# Patient Record
Sex: Male | Born: 1945 | Race: White | Hispanic: No | Marital: Married | Smoking: Never smoker
Health system: Southern US, Community
[De-identification: ages and names within clinical notes are randomized; demographics above are authoritative.]

## PROBLEM LIST (undated history)

## (undated) DIAGNOSIS — I2510 Atherosclerotic heart disease of native coronary artery without angina pectoris: Secondary | ICD-10-CM

## (undated) HISTORY — PX: STENT PLACEMENT VASCULAR (ARMC HX): HXRAD1737

---

## 2022-03-12 ENCOUNTER — Emergency Department (HOSPITAL_BASED_OUTPATIENT_CLINIC_OR_DEPARTMENT_OTHER): Payer: Medicare HMO

## 2022-03-12 ENCOUNTER — Emergency Department (HOSPITAL_BASED_OUTPATIENT_CLINIC_OR_DEPARTMENT_OTHER)
Admission: EM | Admit: 2022-03-12 | Discharge: 2022-03-12 | Disposition: A | Discharge status: Home / Self Care | Payer: Medicare HMO | Attending: Emergency Medicine | Admitting: Emergency Medicine

## 2022-03-12 ENCOUNTER — Other Ambulatory Visit (HOSPITAL_BASED_OUTPATIENT_CLINIC_OR_DEPARTMENT_OTHER): Payer: Self-pay

## 2022-03-12 ENCOUNTER — Encounter (HOSPITAL_BASED_OUTPATIENT_CLINIC_OR_DEPARTMENT_OTHER): Payer: Self-pay | Admitting: Emergency Medicine

## 2022-03-12 ENCOUNTER — Other Ambulatory Visit: Payer: Self-pay

## 2022-03-12 DIAGNOSIS — R079 Chest pain, unspecified: Secondary | ICD-10-CM | POA: Insufficient documentation

## 2022-03-12 DIAGNOSIS — Z20822 Contact with and (suspected) exposure to covid-19: Secondary | ICD-10-CM | POA: Insufficient documentation

## 2022-03-12 DIAGNOSIS — J181 Lobar pneumonia, unspecified organism: Secondary | ICD-10-CM | POA: Diagnosis not present

## 2022-03-12 DIAGNOSIS — W19XXXA Unspecified fall, initial encounter: Secondary | ICD-10-CM | POA: Diagnosis not present

## 2022-03-12 DIAGNOSIS — J189 Pneumonia, unspecified organism: Secondary | ICD-10-CM

## 2022-03-12 DIAGNOSIS — I2510 Atherosclerotic heart disease of native coronary artery without angina pectoris: Secondary | ICD-10-CM | POA: Diagnosis not present

## 2022-03-12 DIAGNOSIS — S90912A Unspecified superficial injury of left ankle, initial encounter: Secondary | ICD-10-CM | POA: Insufficient documentation

## 2022-03-12 HISTORY — DX: Atherosclerotic heart disease of native coronary artery without angina pectoris: I25.10

## 2022-03-12 LAB — CBC
HCT: 47 % (ref 39.0–52.0)
Hemoglobin: 16.1 g/dL (ref 13.0–17.0)
MCH: 31.3 pg (ref 26.0–34.0)
MCHC: 34.3 g/dL (ref 30.0–36.0)
MCV: 91.3 fL (ref 80.0–100.0)
Platelets: 177 10*3/uL (ref 150–400)
RBC: 5.15 MIL/uL (ref 4.22–5.81)
RDW: 13.1 % (ref 11.5–15.5)
WBC: 9.9 10*3/uL (ref 4.0–10.5)
nRBC: 0 % (ref 0.0–0.2)

## 2022-03-12 LAB — BASIC METABOLIC PANEL
Anion gap: 7 (ref 5–15)
BUN: 15 mg/dL (ref 8–23)
CO2: 24 mmol/L (ref 22–32)
Calcium: 9.3 mg/dL (ref 8.9–10.3)
Chloride: 109 mmol/L (ref 98–111)
Creatinine, Ser: 1.01 mg/dL (ref 0.61–1.24)
GFR, Estimated: >60 mL/min (ref >60)
Glucose, Bld: 124 mg/dL — ABNORMAL HIGH (ref 70–99)
Potassium: 4.1 mmol/L (ref 3.5–5.1)
Sodium: 140 mmol/L (ref 135–145)

## 2022-03-12 LAB — TROPONIN I (HIGH SENSITIVITY): Troponin I (High Sensitivity): 4 ng/L (ref <18)

## 2022-03-12 LAB — RESP PANEL BY RT-PCR (FLU A&B, COVID) ARPGX2
Influenza A by PCR: NEGATIVE
Influenza B by PCR: NEGATIVE
SARS Coronavirus 2 by RT PCR: NEGATIVE

## 2022-03-12 MED ORDER — AZITHROMYCIN 250 MG PO TABS
250.0000 mg | ORAL_TABLET | Freq: Every day | ORAL | 0 refills | Status: DC
  Filled 2022-03-12: qty 6, 5d supply, fill #0

## 2022-03-12 MED ORDER — CEFDINIR 300 MG PO CAPS
300.0000 mg | ORAL_CAPSULE | Freq: Two times a day (BID) | ORAL | 0 refills | Status: AC
  Filled 2022-03-12: qty 20, 10d supply, fill #0

## 2022-03-12 NOTE — ED Provider Notes (Signed)
?[facility] EMERGENCY DEPARTMENT ?Provider Note ? ? ?CSN: [id number] ?Arrival date & time: 03/12/22  0941 ? ?  ? ?History ? ?Chief Complaint  ?Patient presents with  ? URI  ? ? ?Benjamin [name] is a 76 y.o. male. ? ?HPI ? ?  ? ?Cough and congestion for 1 week ?Left sided chest pain this AM, came and went, burning sharp pain to left side ?Cough worse when trying to sleep at night ?No shortness of breath, no orthopnea, no fevers ?No leg pain or swelling, sprained an ankle a week ago, fell, has pain over ankle but no leg pain. Has been able to walk on it. Feels like sprained ankles in past. Did not fall, no other injury other than ankle.  ? ? ?Past Medical History:  ?Diagnosis Date  ? Coronary artery disease   ?  ? ?Home Medications ?Prior to Admission medications   ?Medication Sig Start Date End Date Taking? Authorizing Provider  ?azithromycin (ZITHROMAX) 250 MG tablet Take 2 tablets by mouth on day 1, then take 1 tablet daily until finished. 03/12/22  Yes Alvira Monday, MD  ?cefdinir (OMNICEF) 300 MG capsule Take 1 capsule (300 mg total) by mouth 2 (two) times daily for 10 days. 03/12/22 03/22/22 Yes Alvira Monday, MD  ?   ? ?Allergies    ?Penicillins   ? ?Review of Systems   ?Review of Systems ? ?Physical Exam ?Updated Vital Signs ?BP (!) 145/78   Pulse 73   Temp 98.2 ?F (36.8 ?C)   Resp 18   SpO2 93%  ?Physical Exam ?Vitals and nursing note reviewed.  ?Constitutional:   ?   General: He is not in acute distress. ?   Appearance: He is well-developed. He is not diaphoretic.  ?HENT:  ?   Head: Normocephalic and atraumatic.  ?Eyes:  ?   Conjunctiva/sclera: Conjunctivae normal.  ?Cardiovascular:  ?   Rate and Rhythm: Normal rate and regular rhythm.  ?   Heart sounds: Normal heart sounds. No murmur heard. ?  No friction rub. No gallop.  ?Pulmonary:  ?   Effort: Pulmonary effort is normal. No respiratory distress.  ?   Breath sounds: Normal breath sounds. No wheezing or rales.  ?Abdominal:  ?   General:  There is no distension.  ?   Palpations: Abdomen is soft.  ?   Tenderness: There is no abdominal tenderness. There is no guarding.  ?Musculoskeletal:     ?   General: Signs of injury (left ankle with tenderness ATFL) present.  ?   Cervical back: Normal range of motion.  ?Skin: ?   General: Skin is warm and dry.  ?Neurological:  ?   Mental Status: He is alert and oriented to person, place, and time.  ? ? ?ED Results / Procedures / Treatments   ?Labs ?(all labs ordered are listed, but only abnormal results are displayed) ?Labs Reviewed  ?BASIC METABOLIC PANEL - Abnormal; Notable for the following components:  ?    Result Value  ? Glucose, Bld 124 (*)   ? All other components within normal limits  ?RESP PANEL BY RT-PCR (FLU A&B, COVID) [id number]  ?CBC  ?TROPONIN I (HIGH SENSITIVITY)  ?TROPONIN I (HIGH SENSITIVITY)  ? ? ?EKG ?EKG Interpretation ? ?Date/Time:  Thursday March 12 2022 10:16:46 EDT ?Ventricular Rate:  85 ?PR Interval:  146 ?QRS Duration: 84 ?QT Interval:  366 ?QTC Calculation: 435 ?R Axis:   -48 ?Text Interpretation: Normal sinus rhythm Left anterior fascicular block Abnormal  ECG No previous ECGs available Confirmed by Alvira Monday ([id number]) on 03/12/2022 10:34:13 AM ? ?Radiology ?DG Chest 2 View ? ?Result Date: 03/12/2022 ?CLINICAL DATA:  Chest pain. EXAM: CHEST - 2 VIEW COMPARISON:  August 09, 2014. FINDINGS: The heart size and mediastinal contours are within normal limits. Mild right infrahilar atelectasis or infiltrate is noted. Mild left basilar subsegmental atelectasis or scarring is noted. The visualized skeletal structures are unremarkable. IMPRESSION: Mild left basilar subsegmental atelectasis or scarring. Mild right infrahilar atelectasis or infiltrate. Electronically Signed   By: Lupita Raider M.D.   On: 03/12/2022 10:26   ? ?Procedures ?Procedures  ? ? ?Medications Ordered in ED ?Medications - No data to display ? ?ED Course/ Medical Decision Making/ A&P ?  ?                        ?Medical  Decision Making ?Amount and/or Complexity of Data Reviewed ?Labs: ordered. ?Radiology: ordered. ? ?Risk ?Prescription drug management. ? ? ?76 year old male with a history of coronary artery disease presents with cough and congestion for 1 week, and a sharp pain to his left side. ? ?Differential diagnosis for chest pain includes pulmonary embolus, dissection, pneumothorax, pneumonia, ACS, myocarditis, pericarditis.  EKG was done and evaluate by me and showed no acute ST changes and no signs of pericarditis.  ? ?Chest x-ray was done and evaluated by me and radiology and showed concern for right-sided infiltrate, and given history of increasing cough suspect this represents a pneumonia.  No sign of pneumothorax.   ? ?Have low suspicion for pulmonary embolus given no shortness of breath, tachycardia, no asymmetric leg swelling, immobilization, recent surgery and history more consistent with pneumonia. Troponin negative, this pain feels different than prior chest pain he has had related to coronary artery disease, is very localized, and likely musculoskeletal secondary to severe cough and pneumonia, and have low suspicion for ACS do not feel history or exam are consistent with aortic dissection. No leg swelling or findings on exam to suggest CHF as etiology of coughing.  ? ?He is afebrile, with a normal white blood cell count, hemodynamically stable, and appropriate for outpatient treatment of pneumonia. ? ?Given prescription for cefdinir and azithromycin.  Recommend close primary care physician follow-up, and discussed reasons to return. ? ? ? ? ? ? ? ? ? ?Final Clinical Impression(s) / ED Diagnoses ?Final diagnoses:  ?Pneumonia of right middle lobe due to infectious organism  ? ? ?Rx / DC Orders ?ED Discharge Orders   ? ?      Ordered  ?  azithromycin (ZITHROMAX) 250 MG tablet  Daily       ? 03/12/22 1209  ?  cefdinir (OMNICEF) 300 MG capsule  2 times daily       ? 03/12/22 1209  ? ?  ?  ? ?  ? ? ?  ?Alvira Monday,  MD ?03/13/22 0825 ? ?

## 2022-03-12 NOTE — ED Triage Notes (Signed)
Pt here from home with c/o cough and sinus drainage has tried several otc meds for allergies without relief , no fevers , also had a sharp pain in his left  side of his chest this morning also  ?

## 2022-08-12 ENCOUNTER — Observation Stay (HOSPITAL_BASED_OUTPATIENT_CLINIC_OR_DEPARTMENT_OTHER)
Admission: EM | Admit: 2022-08-12 | Discharge: 2022-08-14 | Disposition: A | Discharge status: Home / Self Care | Payer: Medicare HMO | Attending: Internal Medicine | Admitting: Internal Medicine

## 2022-08-12 ENCOUNTER — Encounter (HOSPITAL_BASED_OUTPATIENT_CLINIC_OR_DEPARTMENT_OTHER): Payer: Self-pay

## 2022-08-12 ENCOUNTER — Other Ambulatory Visit: Payer: Self-pay

## 2022-08-12 ENCOUNTER — Emergency Department (HOSPITAL_BASED_OUTPATIENT_CLINIC_OR_DEPARTMENT_OTHER): Payer: Medicare HMO

## 2022-08-12 ENCOUNTER — Encounter (HOSPITAL_COMMUNITY): Payer: Self-pay

## 2022-08-12 DIAGNOSIS — E785 Hyperlipidemia, unspecified: Secondary | ICD-10-CM | POA: Diagnosis present

## 2022-08-12 DIAGNOSIS — I2489 Other forms of acute ischemic heart disease: Secondary | ICD-10-CM | POA: Diagnosis present

## 2022-08-12 DIAGNOSIS — I248 Other forms of acute ischemic heart disease: Secondary | ICD-10-CM | POA: Diagnosis present

## 2022-08-12 DIAGNOSIS — I5042 Chronic combined systolic (congestive) and diastolic (congestive) heart failure: Secondary | ICD-10-CM | POA: Insufficient documentation

## 2022-08-12 DIAGNOSIS — I110 Hypertensive heart disease with heart failure: Secondary | ICD-10-CM | POA: Diagnosis not present

## 2022-08-12 DIAGNOSIS — I10 Essential (primary) hypertension: Secondary | ICD-10-CM | POA: Diagnosis not present

## 2022-08-12 DIAGNOSIS — I2510 Atherosclerotic heart disease of native coronary artery without angina pectoris: Secondary | ICD-10-CM | POA: Insufficient documentation

## 2022-08-12 DIAGNOSIS — I214 Non-ST elevation (NSTEMI) myocardial infarction: Secondary | ICD-10-CM | POA: Diagnosis not present

## 2022-08-12 DIAGNOSIS — Z79899 Other long term (current) drug therapy: Secondary | ICD-10-CM | POA: Diagnosis not present

## 2022-08-12 DIAGNOSIS — R079 Chest pain, unspecified: Secondary | ICD-10-CM

## 2022-08-12 DIAGNOSIS — I5020 Unspecified systolic (congestive) heart failure: Secondary | ICD-10-CM

## 2022-08-12 DIAGNOSIS — Z7982 Long term (current) use of aspirin: Secondary | ICD-10-CM | POA: Insufficient documentation

## 2022-08-12 DIAGNOSIS — N400 Enlarged prostate without lower urinary tract symptoms: Secondary | ICD-10-CM | POA: Diagnosis present

## 2022-08-12 DIAGNOSIS — R5383 Other fatigue: Secondary | ICD-10-CM | POA: Diagnosis present

## 2022-08-12 LAB — CBC WITH DIFFERENTIAL/PLATELET
Abs Immature Granulocytes: 0.02 10*3/uL (ref 0.00–0.07)
Basophils Absolute: 0 10*3/uL (ref 0.0–0.1)
Basophils Relative: 1 %
Eosinophils Absolute: 0.3 10*3/uL (ref 0.0–0.5)
Eosinophils Relative: 4 %
HCT: 41.9 % (ref 39.0–52.0)
Hemoglobin: 14.5 g/dL (ref 13.0–17.0)
Immature Granulocytes: 0 %
Lymphocytes Relative: 19 %
Lymphs Abs: 1.5 10*3/uL (ref 0.7–4.0)
MCH: 31.9 pg (ref 26.0–34.0)
MCHC: 34.6 g/dL (ref 30.0–36.0)
MCV: 92.3 fL (ref 80.0–100.0)
Monocytes Absolute: 1 10*3/uL (ref 0.1–1.0)
Monocytes Relative: 12 %
Neutro Abs: 5 10*3/uL (ref 1.7–7.7)
Neutrophils Relative %: 64 %
Platelets: 168 10*3/uL (ref 150–400)
RBC: 4.54 MIL/uL (ref 4.22–5.81)
RDW: 12.8 % (ref 11.5–15.5)
WBC: 7.8 10*3/uL (ref 4.0–10.5)
nRBC: 0 % (ref 0.0–0.2)

## 2022-08-12 LAB — COMPREHENSIVE METABOLIC PANEL
ALT: 32 U/L (ref 0–44)
AST: 26 U/L (ref 15–41)
Albumin: 3.6 g/dL (ref 3.5–5.0)
Alkaline Phosphatase: 70 U/L (ref 38–126)
Anion gap: 6 (ref 5–15)
BUN: 17 mg/dL (ref 8–23)
CO2: 22 mmol/L (ref 22–32)
Calcium: 8.3 mg/dL — ABNORMAL LOW (ref 8.9–10.3)
Chloride: 110 mmol/L (ref 98–111)
Creatinine, Ser: 0.91 mg/dL (ref 0.61–1.24)
GFR, Estimated: >60 mL/min (ref >60)
Glucose, Bld: 128 mg/dL — ABNORMAL HIGH (ref 70–99)
Potassium: 3.7 mmol/L (ref 3.5–5.1)
Sodium: 138 mmol/L (ref 135–145)
Total Bilirubin: 0.6 mg/dL (ref 0.3–1.2)
Total Protein: 6.6 g/dL (ref 6.5–8.1)

## 2022-08-12 LAB — TROPONIN I (HIGH SENSITIVITY)
Troponin I (High Sensitivity): 344 ng/L (ref <18)
Troponin I (High Sensitivity): 410 ng/L (ref <18)
Troponin I (High Sensitivity): 231 ng/L (ref <18)
Troponin I (High Sensitivity): 423 ng/L (ref <18)

## 2022-08-12 LAB — LIPASE, BLOOD: Lipase: 29 U/L (ref 11–51)

## 2022-08-12 MED ORDER — ACETAMINOPHEN 325 MG PO TABS
650.0000 mg | ORAL_TABLET | Freq: Four times a day (QID) | ORAL | Status: DC | PRN
  Administered 2022-08-13 (×3): 650 mg via ORAL
  Filled 2022-08-12 (×2): qty 2

## 2022-08-12 MED ORDER — HEPARIN BOLUS VIA INFUSION
4000.0000 [IU] | Freq: Once | INTRAVENOUS | Status: AC
  Administered 2022-08-12: 4000 [IU] via INTRAVENOUS

## 2022-08-12 MED ORDER — HEPARIN SODIUM (PORCINE) 5000 UNIT/ML IJ SOLN: 4000.0000 [IU] | Freq: Once | INTRAMUSCULAR | Status: DC

## 2022-08-12 MED ORDER — SODIUM CHLORIDE 0.9% FLUSH
3.0000 mL | Freq: Two times a day (BID) | INTRAVENOUS | Status: DC
  Administered 2022-08-13 – 2022-08-14 (×3): 3 mL via INTRAVENOUS

## 2022-08-12 MED ORDER — ALBUTEROL SULFATE (2.5 MG/3ML) 0.083% IN NEBU: 2.5000 mg | INHALATION_SOLUTION | Freq: Four times a day (QID) | RESPIRATORY_TRACT | Status: DC | PRN

## 2022-08-12 MED ORDER — ACETAMINOPHEN 650 MG RE SUPP: 650.0000 mg | Freq: Four times a day (QID) | RECTAL | Status: DC | PRN

## 2022-08-12 MED ORDER — HEPARIN (PORCINE) 25000 UT/250ML-% IV SOLN
1200.0000 [IU]/h | INTRAVENOUS | Status: DC
  Administered 2022-08-12 – 2022-08-13 (×2): 1200 [IU]/h via INTRAVENOUS
  Filled 2022-08-12 (×2): qty 250

## 2022-08-12 MED ORDER — INFLUENZA VAC A&B SA ADJ QUAD 0.5 ML IM PRSY
0.5000 mL | PREFILLED_SYRINGE | INTRAMUSCULAR | Status: DC
  Filled 2022-08-12: qty 0.5

## 2022-08-12 MED ORDER — CALCIUM GLUCONATE-NACL 2-0.675 GM/100ML-% IV SOLN
2.0000 g | Freq: Once | INTRAVENOUS | Status: AC
  Administered 2022-08-13: 2000 mg via INTRAVENOUS
  Filled 2022-08-12: qty 100

## 2022-08-12 MED ORDER — ASPIRIN 81 MG PO CHEW
324.0000 mg | CHEWABLE_TABLET | Freq: Once | ORAL | Status: AC
  Administered 2022-08-12: 324 mg via ORAL
  Filled 2022-08-12: qty 4

## 2022-08-12 MED ORDER — HEPARIN (PORCINE) 25000 UT/250ML-% IV SOLN: 12.0000 [IU]/kg/h | INTRAVENOUS | Status: DC

## 2022-08-12 NOTE — Progress Notes (Signed)
ANTICOAGULATION CONSULT NOTE - Initial Consult  Pharmacy Consult for heparin Indication: chest pain/ACS  Allergies  Allergen Reactions   Penicillins     Patient Measurements: Height: 6' (182.9 cm) Weight: 88.9 kg (196 lb) IBW/kg (Calculated) : 77.6 Heparin Dosing Weight: 88.9kg  Vital Signs: Temp: 97.5 F (36.4 C) (09/13 1412) Temp Source: Oral (09/13 1412) BP: 151/91 (09/13 1600) Pulse Rate: 65 (09/13 1600)  Labs: Recent Labs    08/12/22 1439 08/12/22 1440  HGB 14.5  --   HCT 41.9  --   PLT 168  --   CREATININE 0.91  --   TROPONINIHS  --  344*    Estimated Creatinine Clearance: 75.8 mL/min (by C-G formula based on SCr of 0.91 mg/dL).   Medical History: Past Medical History:  Diagnosis Date   Coronary artery disease     Medications:  (Not in a hospital admission)  Scheduled:   heparin  4,000 Units Intravenous Once    Assessment: 11 yoM with PMH of CAD who presented with high blood pressure and fatigue. Pharmacy consulted to dose heparin for ACS/NSTEMI. Troponins elevated at 344 with CBC stable. No PTA anticoagulation  Goal of Therapy:  Heparin level 0.3-0.7 units/ml Monitor platelets by anticoagulation protocol: Yes   Plan:  Give 4000 units bolus x 1 Start heparin infusion at 1200 units/hr Check anti-Xa level in 8 hours and daily while on heparin Continue to monitor H&H and platelets  Arabella Merles, PharmD. [facility] Acute Care PGY-1  08/12/2022 4:25 PM

## 2022-08-12 NOTE — ED Provider Notes (Signed)
[facility] EMERGENCY DEPARTMENT Provider Note   CSN: [id number] Arrival date & time: 08/12/22  1404     History  Chief Complaint  Patient presents with   Hypertension    Benjamin [name] is a 76 y.o. male.  Patient here with elevated high blood pressure, fatigue.  History of CAD.  He had some indigestion about 5 days ago after eating some fried food.  He has not had the same energy level here the last several weeks.  He has noticed his blood pressure mildly elevated recently.  He states he did play tennis 2 days ago without any issues.  Has no active chest pain or shortness of breath or abdominal pain or headaches, vision changes or stroke symptoms.  Has had 4 stents in the past.  Last had stents in 2017.  Supposed to follow-up with his cardiologist in December.  Denies any infectious symptoms including cough and sputum production.  No current abdominal pain.  The history is provided by the patient.       Home Medications Prior to Admission medications   Medication Sig Start Date End Date Taking? Authorizing Provider  Alpha-Lipoic Acid (LIPOIC ACID PO) Take 1 capsule by mouth daily.   Yes [provider]  aspirin EC 81 MG tablet Take 81 mg by mouth daily.   Yes [provider]  atorvastatin (LIPITOR) 80 MG tablet Take 80 mg by mouth at bedtime. 05/25/22  Yes [provider]  carvedilol (COREG) 6.25 MG tablet Take 6.25 mg by mouth 2 (two) times daily. 06/18/22  Yes [provider]  Cholecalciferol 50 MCG (2000 UT) CAPS Take 2,000 Units by mouth daily.   Yes [provider]  Cyanocobalamin (VITAMIN B 12 PO) Take 1 tablet by mouth daily.   Yes [provider]  Multiple Vitamins-Minerals (MULTIVITAMIN WITH MINERALS) tablet Take 1 tablet by mouth daily. One a Day Mens   Yes [provider]  Omega-3 1000 MG CAPS Take 1,000 mg by mouth daily.   Yes [provider]  pantoprazole (PROTONIX) 40 MG tablet Take  40 mg by mouth daily. 06/03/22  Yes [provider]  tamsulosin (FLOMAX) 0.4 MG CAPS capsule Take 0.4 mg by mouth at bedtime. 05/23/22  Yes [provider]      Allergies    Penicillins    Review of Systems   Review of Systems  Physical Exam Updated Vital Signs BP 137/80   Pulse (!) 56   Temp (!) 97.5 F (36.4 C) (Oral)   Resp 18   Ht 6\' 1"  (1.854 m)   Wt 89 kg   SpO2 93%   BMI 25.89 kg/m  Physical Exam Vitals and nursing note reviewed.  Constitutional:      General: He is not in acute distress.    Appearance: He is well-developed. He is not ill-appearing.  HENT:     Head: Normocephalic and atraumatic.     Nose: Nose normal.     Mouth/Throat:     Mouth: Mucous membranes are moist.  Eyes:     Extraocular Movements: Extraocular movements intact.     Conjunctiva/sclera: Conjunctivae normal.     Pupils: Pupils are equal, round, and reactive to light.  Cardiovascular:     Rate and Rhythm: Normal rate and regular rhythm.     Pulses: Normal pulses.     Heart sounds: Normal heart sounds. No murmur heard. Pulmonary:     Effort: Pulmonary effort is normal. No respiratory distress.  Breath sounds: Normal breath sounds.  Abdominal:     Palpations: Abdomen is soft.     Tenderness: There is no abdominal tenderness.  Musculoskeletal:        General: No swelling. Normal range of motion.     Cervical back: Normal range of motion and neck supple.  Skin:    General: Skin is warm and dry.     Capillary Refill: Capillary refill takes less than 2 seconds.  Neurological:     General: No focal deficit present.     Mental Status: He is alert and oriented to person, place, and time.     Cranial Nerves: No cranial nerve deficit.     Sensory: No sensory deficit.     Motor: No weakness.     Coordination: Coordination normal.     Comments: 5+ out of 5 strength throughout, normal sensation, no drift, normal finger-nose-finger, normal speech  Psychiatric:        Mood  and Affect: Mood normal.     ED Results / Procedures / Treatments   Labs (all labs ordered are listed, but only abnormal results are displayed) Labs Reviewed  COMPREHENSIVE METABOLIC PANEL - Abnormal; Notable for the following components:      Result Value   Glucose, Bld 128 (*)    Calcium 8.3 (*)    All other components within normal limits  LIPID PANEL - Abnormal; Notable for the following components:   HDL 30 (*)    All other components within normal limits  HEPATIC FUNCTION PANEL - Abnormal; Notable for the following components:   Total Protein 6.1 (*)    Albumin 3.4 (*)    All other components within normal limits  TROPONIN I (HIGH SENSITIVITY) - Abnormal; Notable for the following components:   Troponin I (High Sensitivity) 344 (*)    All other components within normal limits  TROPONIN I (HIGH SENSITIVITY) - Abnormal; Notable for the following components:   Troponin I (High Sensitivity) 410 (*)    All other components within normal limits  TROPONIN I (HIGH SENSITIVITY) - Abnormal; Notable for the following components:   Troponin I (High Sensitivity) 231 (*)    All other components within normal limits  TROPONIN I (HIGH SENSITIVITY) - Abnormal; Notable for the following components:   Troponin I (High Sensitivity) 423 (*)    All other components within normal limits  MRSA NEXT GEN BY PCR, NASAL  CBC WITH DIFFERENTIAL/PLATELET  LIPASE, BLOOD  HEPARIN LEVEL (UNFRACTIONATED)  CBC  BASIC METABOLIC PANEL  TSH  MAGNESIUM    EKG EKG Interpretation  Date/Time:  Wednesday August 12 2022 17:00:20 EDT Ventricular Rate:  62 PR Interval:  149 QRS Duration: 98 QT Interval:  443 QTC Calculation: 450 R Axis:   -1 Text Interpretation: Sinus rhythm Low voltage, precordial leads Abnormal R-wave progression, early transition Confirmed by Drema Pry [phone]) on 08/13/2022 1:32:16 PM  Radiology DG Chest Portable 1 View  Result Date: 08/12/2022 CLINICAL DATA:  Fatigue EXAM:  PORTABLE CHEST 1 VIEW COMPARISON:  Chest radiograph 03/12/2022 FINDINGS: The cardiomediastinal silhouette is within normal limits. Mild linear opacities in the lung bases likely reflect subsegmental atelectasis. There is no focal consolidation or pulmonary edema. There is no pleural effusion or pneumothorax There is no acute osseous abnormality. IMPRESSION: Probable bibasilar subsegmental atelectasis. Otherwise, no radiographic evidence of acute cardiopulmonary process. Electronically Signed   By: Lesia Hausen M.D.   On: 08/12/2022 14:54    Procedures Procedures    Medications Ordered in  ED Medications  heparin ADULT infusion 100 units/mL (25000 units/261mL) (0 Units/hr Intravenous Stopped 08/13/22 1411)  sodium chloride flush (NS) 0.9 % injection 3 mL ( Intravenous Automatically Held 08/21/22 2200)  acetaminophen (TYLENOL) tablet 650 mg ( Oral MAR Hold 08/13/22 1425)    Or  acetaminophen (TYLENOL) suppository 650 mg ( Rectal MAR Hold 08/13/22 1425)  albuterol (PROVENTIL) (2.5 MG/3ML) 0.083% nebulizer solution 2.5 mg ( Nebulization MAR Hold 08/13/22 1425)  aspirin EC tablet 81 mg ( Oral Automatically Held 08/21/22 1000)  atorvastatin (LIPITOR) tablet 80 mg ( Oral Automatically Held 08/21/22 2200)  pantoprazole (PROTONIX) EC tablet 40 mg ( Oral Automatically Held 08/21/22 1000)  carvedilol (COREG) tablet 6.25 mg ( Oral Automatically Held 08/21/22 2200)  tamsulosin (FLOMAX) capsule 0.4 mg ( Oral Automatically Held 08/22/22 2200)  nitroGLYCERIN (NITROSTAT) SL tablet 0.4 mg ( Sublingual MAR Hold 08/13/22 1425)  Oral care mouth rinse ( Mouth Rinse MAR Hold 08/13/22 1425)  sodium chloride flush (NS) 0.9 % injection 3 mL (3 mLs Intravenous Not Given 08/13/22 1337)  sodium chloride flush (NS) 0.9 % injection 3 mL (has no administration in time range)  0.9 %  sodium chloride infusion (has no administration in time range)  aspirin chewable tablet 81 mg (has no administration in time range)  influenza vaccine  adjuvanted (FLUAD) injection 0.5 mL (has no administration in time range)  0.9% sodium chloride infusion (3 mL/kg/hr  89 kg Intravenous New Bag/Given 08/13/22 1301)    Followed by  0.9% sodium chloride infusion (1 mL/kg/hr  89 kg Intravenous New Bag/Given 08/13/22 1343)  Heparin (Porcine) in NaCl 1000-0.9 UT/500ML-% SOLN (500 mLs  Given 08/13/22 1429)  lidocaine (PF) (XYLOCAINE) 1 % injection (2 mLs  Given 08/13/22 1433)  Radial Cocktail/Verapamil only (10 mLs Intra-arterial Given 08/13/22 1434)  heparin sodium (porcine) injection (5,000 Units Intravenous Given 08/13/22 1437)  fentaNYL (SUBLIMAZE) injection (25 mcg Intravenous Given 08/13/22 1449)  midazolam (VERSED) injection (1 mg Intravenous Given 08/13/22 1449)  aspirin chewable tablet 324 mg (324 mg Oral Given 08/12/22 1605)  heparin bolus via infusion 4,000 Units (4,000 Units Intravenous Bolus from Bag 08/12/22 1639)  calcium gluconate 2 g/ 100 mL sodium chloride IVPB (0 mg Intravenous Stopped 08/13/22 0113)  potassium chloride SA (KLOR-CON M) CR tablet 40 mEq (40 mEq Oral Given 08/13/22 0844)    ED Course/ Medical Decision Making/ A&P Clinical Course as of 08/13/22 1504  Wed Aug 12, 2022  1501 Assumed care from Dr Lockie Mola. 76 yo M with hx of CAD sp PCI in 2017 and HTN who presented with generalized fatigue for several weeks. Follows with cards at [facility] regional. With EKG that is reassuring. CXR with atelectasis. No infectious symptoms. Planning on troponin and DC if WNL with OP fu with his PCP.  [RP]  1607 Troponin noted to be elevated.  Cardiology consulted.  Repeat troponin ordered.  Patient loaded with aspirin. [RP]  1613 Dr Sanjuana Kava from cards recommends starting heparin drip and admitting to medicine.  They will follow along as consult. [RP]  1633 Called by Dr. Jarvis Newcomer hospitalist.  He will discuss again with cardiology to see which service would be most appropriate for the patient. [RP]  1658 Dr Jarvis Newcomer from hospitalist has called back and  states that he will admit the patient to [facility] with cardiology following. [RP]    Clinical Course User Index [RP] Rondel Baton, MD  Medical Decision Making Amount and/or Complexity of Data Reviewed Labs: ordered. Radiology: ordered.  Risk OTC drugs. Prescription drug management. Decision regarding hospitalization.   Benjamin [name] [name] is here with high blood pressure, fatigue.  Blood pressure mildly elevated 160/99.  Otherwise normal vitals.  History of CAD status post stents.  No active chest pain or headache or shortness of breath.  He has been having some general fatigue here recently however play tennis several days ago without any issues.  He states he had bad indigestion episode last week after eating fried food.  He is not having any stroke symptoms.  Differential diagnosis is likely benign process but will check basic labs to evaluate for ACS, electrolyte abnormality, anemia.  Denies any infectious symptoms we will get chest x-ray.  We will get CBC, CMP, troponin, chest x-ray.  EKG shows sinus rhythm with no ischemic changes per my review and interpretation of labs.  Overall have low concern for stroke or ACS.  Patient handed off to oncoming ED staff with patient pending blood work and imaging.  Please see their note for further results, evaluation, disposition of the patient.  This chart was dictated using voice recognition software.  Despite best efforts to proofread,  errors can occur which can change the documentation meaning.        Final Clinical Impression(s) / ED Diagnoses Final diagnoses:  Chest pain, unspecified type    Rx / DC Orders ED Discharge Orders     None         Virgina Norfolk, DO 08/13/22 1504

## 2022-08-12 NOTE — ED Notes (Signed)
Report called to  Messan RN

## 2022-08-12 NOTE — ED Triage Notes (Signed)
Patient reports higher than normal blood pressures over the last two weeks. Denies any pain - states "I just feel weird" States his readings over the week have looked like: 151/102   149/99  141/102  Patient also complains of left shoulder pain.

## 2022-08-12 NOTE — ED Provider Notes (Signed)
  Physical Exam  BP (!) 164/99 (BP Location: Left Arm)   Pulse 66   Temp (!) 97.5 F (36.4 C) (Oral)   Resp 20   Ht 6' (1.829 m)   Wt 88.9 kg   SpO2 96%   BMI 26.58 kg/m   Physical Exam  Procedures  Procedures  ED Course / MDM   Clinical Course as of 08/13/22 1103  Wed Aug 12, 2022  1501 Assumed care from Dr Lockie Mola. 76 yo M with hx of CAD sp PCI in 2017 and HTN who presented with generalized fatigue for several weeks. Follows with cards at [facility] regional. With EKG that is reassuring. CXR with atelectasis. No infectious symptoms. Planning on troponin and DC if WNL with OP fu with his PCP.  [RP]  1607 Troponin noted to be elevated.  Cardiology consulted.  Repeat troponin ordered.  Patient loaded with aspirin. [RP]  1613 Dr Sanjuana Kava from cards recommends starting heparin drip and admitting to medicine.  They will follow along as consult. [RP]  1633 Called by Dr. Jarvis Newcomer hospitalist.  He will discuss again with cardiology to see which service would be most appropriate for the patient. [RP]  1658 Dr Jarvis Newcomer from hospitalist has called back and states that he will admit the patient to [facility] with cardiology following. [RP]    Clinical Course User Index [RP] Rondel Baton, MD   Medical Decision Making Amount and/or Complexity of Data Reviewed Labs: ordered. Radiology: ordered.  Risk OTC drugs. Prescription drug management. Decision regarding hospitalization.      Rondel Baton, MD 08/13/22 1106

## 2022-08-12 NOTE — H&P (Signed)
History and Physical    Patient: Benjamin [name]:[id number] DOB: 1946/09/08 DOA: 08/12/2022 DOS: the patient was seen and examined on 08/12/2022 PCP: Pcp, No  Patient coming from: Home  Chief Complaint:  Chief Complaint  Patient presents with   Hypertension   HPI: Benjamin [name] is a 76 y.o. male with medical history significant of  HLD, CAD s/p PCI last in 2017 50s who presents with complaints of fatigue.  Patient states that he has not felt well for about 2 weeks with complaints of low energy.  On 9/8 he had thought he had a bad case of indigestion after eating some fried food complaints of chest pain.  Patient had taken some Gas-X and Tums that evening prior to going to bed with resolution of the symptoms after waking up the following day.  His wife notes that he just had been itself.  He was able to go to the [employer] game over the weekend and played an hour and a half game of tennis 3 days ago.  Denied having any shortness of breath although it was not his greatest gait tenderness.  Over the last couple of days patient did note that his blood pressure which is normally around 120/60 have been elevated up to 140s/70s.  Denies having any shortness of breath, diaphoresis, palpitations, nausea, or vomiting.  His wife informed to the hospital for further evaluation.  On admission to the emergency department patient was seen  to have heart rates 55-66, blood pressures elevated 164/99, and all other vital signs maintained.  EKG showing bradycardia without significant signs of ischemia.  Labs significant for high-sensitivity troponin 344-> 410-> 231.  Patient had been given full dose aspirin and started on a heparin drip.  Cardiology consulted and recommended hospitalist admission.    Review of Systems: As mentioned in the history of present illness. All other systems reviewed and are negative. Past Medical History:  Diagnosis Date   Coronary artery disease    Past Surgical History:   Procedure Laterality Date   STENT PLACEMENT VASCULAR ([facility] HX)     Social History:  reports that he has never smoked. He has never been exposed to tobacco smoke. He has never used smokeless tobacco. He reports current alcohol use. He reports that he does not use drugs.  Allergies  Allergen Reactions   Penicillins     No family history on file.  Prior to Admission medications   Medication Sig Start Date End Date Taking? Authorizing Provider  azithromycin (ZITHROMAX) 250 MG tablet Take 2 tablets by mouth on day 1, then take 1 tablet daily until finished. 03/12/22   Alvira Monday, MD    Physical Exam: Vitals:   08/12/22 2030 08/12/22 2040 08/12/22 2225 08/12/22 2246  BP:  (!) 144/91  (!) 149/92  Pulse: 60 (!) 59  60  Resp: 20 13  19   Temp:    (!) 97.4 F (36.3 C)  TempSrc:    Oral  SpO2: 93% 95%  94%  Weight:   89 kg   Height:   6\' 1"  (1.854 m)     Constitutional: Elderly male currently in no acute distress Eyes: PERRL, lids and conjunctivae normal ENMT: Mucous membranes are moist.  Neck: normal, supple, no JVD appreciated Respiratory: clear to auscultation bilaterally, no wheezing, no crackles. Normal respiratory effort. No accessory muscle use.  Cardiovascular: Bradycardic.  No significant murmur appreciated. No extremity edema. 2+ pedal pulses. No carotid bruits.  Abdomen: no tenderness, no masses  palpated.   Bowel sounds positive.  Musculoskeletal: no clubbing / cyanosis. No joint deformity upper and lower extremities. Good ROM, no contractures. Normal muscle tone.  Skin: no rashes, lesions, ulcers. No induration Neurologic: CN 2-12 grossly intact. Strength 5/5 in all 4.  Psychiatric: Normal judgment and insight. Alert and oriented x 3. Normal mood.   Data Reviewed:  EKG showed sinus bradycardia 57  Assessment and Plan: NSTEMI Acute.  Patient presents with a 2-week history of generalized fatigue.  Noted 1 episode of chest discomfort thought secondary to  indigestion relieved with Tums and Gas-X.  EKG without significant ischemic changes.  High-sensitivity troponins 344-> 410-> 231-> 423.  Patient heart enzymes previously have been within normal limits when checked in April.  Patient had been given full dose aspirin and started on heparin drip. -Admit to a telemetry -N.p.o. for possible need of procedure -Continue heparin drip per pharmacy -Check echocardiogram -Continue aspirin, statin, beta-blocker -Appreciate cardiology consultative services, will follow-up for any further recommendation  Essential hypertension Blood pressures elevated up to 164/99.  Baseline blood pressure normally around 120/70.  Home blood pressure regimen includes Coreg 6.25 mg twice daily. -Continue Coreg as tolerated  Hypocalcemia Acute.  Calcium 8.3 -Give 2 g of calcium gluconate IV -Continue to monitor and replace as needed  Hyperlipidemia Home medication regimen includes atorvastatin 80 mg daily for -Check lipid panel -Continue statin  BPH -Continue Flomax  DVT ppx: Heparin Advance Care Planning:   Code Status: Full Code    Consults: Cardiology  Family Communication: Wife updated at bedside  Severity of Illness: The appropriate patient status for this patient is OBSERVATION. Observation status is judged to be reasonable and necessary in order to provide the required intensity of service to ensure the patient's safety. The patient's presenting symptoms, physical exam findings, and initial radiographic and laboratory data in the context of their medical condition is felt to place them at decreased risk for further clinical deterioration. Furthermore, it is anticipated that the patient will be medically stable for discharge from the hospital within 2 midnights of admission.   Author: Clydie Braun, MD 08/12/2022 10:50 PM  For on call review [email or web address].

## 2022-08-12 NOTE — Consult Note (Signed)
Cardiology Consultation:   Patient ID: Benjamin [name] [name] MRN: [id number]; DOB: 03/23/1946  Admit date: 08/12/2022 Date of Consult: 08/12/2022  Primary Care Provider: Pcp, No Primary Cardiologist: None  Primary Electrophysiologist:  None    Patient Profile:   Benjamin [name] is a 76 y.o. male with a hx of CAD s/p PCI to LAD in 2017, HLD who is being seen today for the evaluation of nstemi at the request of hospital medicine / emergency dept.  History of Present Illness:   Mr. Benjamin [name] is a 76 yo male with CAD s/p PCI to LAD in 2017 and HLD who presented to the emergency department this evening with elevated blood pressures and fatigue. Notes that he has been having sensations of indigestion over the last several days but thought it was related (Friday into Saturday). He mostly has noted some fatigue but still was able to play tennis 2 days ago without major limitations. No other symptoms including DOE, abdominal pain, n/v. When he checked his blood pressure and his fatigue symptoms had worsened, which prompted him to come get evaluated  On arrival to the emergency dept his BP was 160/99 with HR 55-60 (sinus brady). Given his nonspecific symptoms and history of CAD, acs workup was ordered. Trop 344 -> 410 -> 231. Thus cardiology was consulted for further recommendations.   Past Medical History:  Diagnosis Date   Coronary artery disease     Past Surgical History:  Procedure Laterality Date   STENT PLACEMENT VASCULAR ([facility] HX)       Home Medications:  Prior to Admission medications   Medication Sig Start Date End Date Taking? Authorizing Provider  Alpha-Lipoic Acid (LIPOIC ACID PO) Take 1 capsule by mouth daily.   Yes [provider]  aspirin EC 81 MG tablet Take 81 mg by mouth daily.   Yes [provider]  atorvastatin (LIPITOR) 80 MG tablet Take 80 mg by mouth at bedtime. 05/25/22  Yes [provider]  carvedilol (COREG) 6.25 MG tablet Take 6.25 mg by  mouth 2 (two) times daily. 06/18/22  Yes [provider]  Cholecalciferol 50 MCG (2000 UT) CAPS Take 2,000 Units by mouth daily.   Yes [provider]  Cyanocobalamin (VITAMIN B 12 PO) Take 1 tablet by mouth daily.   Yes [provider]  Multiple Vitamins-Minerals (MULTIVITAMIN WITH MINERALS) tablet Take 1 tablet by mouth daily. One a Day Mens   Yes [provider]  Omega-3 1000 MG CAPS Take 1,000 mg by mouth daily.   Yes [provider]  pantoprazole (PROTONIX) 40 MG tablet Take 40 mg by mouth daily. 06/03/22  Yes [provider]  tamsulosin (FLOMAX) 0.4 MG CAPS capsule Take 0.4 mg by mouth at bedtime. 05/23/22  Yes [provider]    Inpatient Medications: Scheduled Meds:  [START ON 08/13/2022] influenza vaccine adjuvanted  0.5 mL Intramuscular Tomorrow-1000   [START ON 08/13/2022] sodium chloride flush  3 mL Intravenous Q12H   Continuous Infusions:  [START ON 08/13/2022] calcium gluconate     heparin 1,200 Units/hr (08/12/22 1639)   PRN Meds: acetaminophen **OR** acetaminophen, albuterol  Allergies:    Allergies  Allergen Reactions   Penicillins Hives and Rash    Social History:   Social History   Socioeconomic History   Marital status: Married    Spouse name: Not on file   Number of children: Not on file   Years of education: Not on file   Highest education level: Not  on file  Occupational History   Not on file  Tobacco Use   Smoking status: Never    Passive exposure: Never   Smokeless tobacco: Never  Vaping Use   Vaping Use: Never used  Substance and Sexual Activity   Alcohol use: Yes    Comment: socially   Drug use: Never   Sexual activity: Not on file  Other Topics Concern   Not on file  Social History Narrative   Not on file   Social Determinants of Health   Financial Resource Strain: Not on file  Food Insecurity: Not on file  Transportation Needs: Not on file  Physical Activity: Not on file   Stress: Not on file  Social Connections: Not on file  Intimate Partner Violence: Not on file    Family History:   No family history on file.   Review of Systems: [y] = yes, [ ]  = no    General: Weight gain [ ] ; Weight loss [ ] ; Anorexia [ ] ; Fatigue [y y]; Fever [ ] ; Chills [ ] ; Weakness [ ]   Cardiac: Chest pain/pressure [ ] ; Resting SOB [ ] ; Exertional SOB [ ] ; Orthopnea [ ] ; Pedal Edema [ ] ; Palpitations [ ] ; Syncope [ ] ; Presyncope [ ] ; Paroxysmal nocturnal dyspnea[ ]   Pulmonary: Cough [ ] ; Wheezing[ ] ; Hemoptysis[ ] ; Sputum [ ] ; Snoring [ ]   GI: Vomiting[ ] ; Dysphagia[ ] ; Melena[ ] ; Hematochezia [ ] ; Heartburn[ y]; Abdominal pain [ ] ; Constipation [ ] ; Diarrhea [ ] ; BRBPR [ ]   GU: Hematuria[ ] ; Dysuria [ ] ; Nocturia[ ]   Vascular: Pain in legs with walking [ ] ; Pain in feet with lying flat [ ] ; Non-healing sores [ ] ; Stroke [ ] ; TIA [ ] ; Slurred speech [ ] ;  Neuro: Headaches[ ] ; Vertigo[ ] ; Seizures[ ] ; Paresthesias[ ] ;Blurred vision [ ] ; Diplopia [ ] ; Vision changes [ ]   Ortho/Skin: Arthritis [ ] ; Joint pain [ ] ; Muscle pain [ ] ; Joint swelling [ ] ; Back Pain [ ] ; Rash [ ]   Psych: Depression[ ] ; Anxiety[ ]   Heme: Bleeding problems [ ] ; Clotting disorders [ ] ; Anemia [ ]   Endocrine: Diabetes [ ] ; Thyroid dysfunction[ ]   Physical Exam/Data:   Vitals:   08/12/22 2030 08/12/22 2040 08/12/22 2225 08/12/22 2246  BP:  (!) 144/91  (!) 149/92  Pulse: 60 (!) 59  60  Resp: 20 13  19   Temp:    (!) 97.4 F (36.3 C)  TempSrc:    Oral  SpO2: 93% 95%  94%  Weight:   89 kg   Height:   6\' 1"  (1.854 m)    No intake or output data in the 24 hours ending 08/12/22 2340 Filed Weights   08/12/22 1413 08/12/22 2225  Weight: 88.9 kg 89 kg   Body mass index is 25.89 kg/m.  General:  Well nourished, well developed, in no acute distress HEENT: normal Lymph: no adenopathy Neck: no JVD Endocrine:  No thryomegaly Vascular: No carotid bruits; FA pulses 2+ bilaterally without bruits  Cardiac:   normal S1, S2; RRR; no murmur  Lungs:  clear to auscultation bilaterally, no wheezing, rhonchi or rales  Abd: soft, nontender, no hepatomegaly  Ext: no edema Musculoskeletal:  No deformities, BUE and BLE strength normal and equal Skin: warm and dry  Neuro:  CNs 2-12 intact, no focal abnormalities noted Psych:  Normal affect   EKG:  The EKG was personally reviewed and demonstrates:  nonspecific t wave changes  Telemetry:  Telemetry was personally reviewed and  demonstrates:  normal sinus rhythm  Relevant CV Studies: none  Laboratory Data:  Chemistry Recent Labs  Lab 08/12/22 1439  NA 138  K 3.7  CL 110  CO2 22  GLUCOSE 128*  BUN 17  CREATININE 0.91  CALCIUM 8.3*  GFRNONAA >60  ANIONGAP 6    Recent Labs  Lab 08/12/22 1439  PROT 6.6  ALBUMIN 3.6  AST 26  ALT 32  ALKPHOS 70  BILITOT 0.6   Hematology Recent Labs  Lab 08/12/22 1439  WBC 7.8  RBC 4.54  HGB 14.5  HCT 41.9  MCV 92.3  MCH 31.9  MCHC 34.6  RDW 12.8  PLT 168   Cardiac EnzymesNo results for input(s): "TROPONINI" in the last 168 hours. No results for input(s): "TROPIPOC" in the last 168 hours.  BNPNo results for input(s): "BNP", "PROBNP" in the last 168 hours.  DDimer No results for input(s): "DDIMER" in the last 168 hours.  Radiology/Studies:  DG Chest Portable 1 View  Result Date: 08/12/2022 CLINICAL DATA:  Fatigue EXAM: PORTABLE CHEST 1 VIEW COMPARISON:  Chest radiograph 03/12/2022 FINDINGS: The cardiomediastinal silhouette is within normal limits. Mild linear opacities in the lung bases likely reflect subsegmental atelectasis. There is no focal consolidation or pulmonary edema. There is no pleural effusion or pneumothorax There is no acute osseous abnormality. IMPRESSION: Probable bibasilar subsegmental atelectasis. Otherwise, no radiographic evidence of acute cardiopulmonary process. Electronically Signed   By: Lesia Hausen M.D.   On: 08/12/2022 14:54    Assessment and Plan:   NSTEMI Given  his history and symptoms, would treat as type 1 MI. Suspect this will be significant blockage given troponin delta - npo after midnight for lhc tomorrow - echo ordered for tomorrow - heparin for anticoagulation - loaded 325 mg ASA; continue 81 mg daily  - hold off on p2y12i until cath - continue high intensity statin with atorvastatin 80mg ; check lipid panel and LP(a) - continue home carvedilol 6.25 mg for beta blocker therapy - plan to start ACEi/ARB post cath - please check CBC, CMP, INR, hemoglobin A1c, tsh/FT4 - referral for cardiac rehab - admit for cardiac tele; strict I&Os; daily weights  - sublingual NTG PRN for pain   For questions or updates, please contact [facility] Please consult [email or web address] for contact info under     Signed, , MD  08/12/2022 11:40 PM

## 2022-08-12 NOTE — ED Notes (Signed)
Report called to [facility]

## 2022-08-13 ENCOUNTER — Encounter (HOSPITAL_COMMUNITY)
Admission: EM | Disposition: A | Discharge status: Home / Self Care | Payer: Self-pay | Source: Home / Self Care | Attending: Family Medicine

## 2022-08-13 ENCOUNTER — Observation Stay (HOSPITAL_BASED_OUTPATIENT_CLINIC_OR_DEPARTMENT_OTHER): Payer: Medicare HMO

## 2022-08-13 DIAGNOSIS — E7800 Pure hypercholesterolemia, unspecified: Secondary | ICD-10-CM | POA: Diagnosis not present

## 2022-08-13 DIAGNOSIS — N400 Enlarged prostate without lower urinary tract symptoms: Secondary | ICD-10-CM | POA: Diagnosis not present

## 2022-08-13 DIAGNOSIS — E785 Hyperlipidemia, unspecified: Secondary | ICD-10-CM | POA: Diagnosis present

## 2022-08-13 DIAGNOSIS — I214 Non-ST elevation (NSTEMI) myocardial infarction: Secondary | ICD-10-CM | POA: Diagnosis not present

## 2022-08-13 DIAGNOSIS — I2510 Atherosclerotic heart disease of native coronary artery without angina pectoris: Secondary | ICD-10-CM | POA: Diagnosis not present

## 2022-08-13 DIAGNOSIS — R778 Other specified abnormalities of plasma proteins: Secondary | ICD-10-CM

## 2022-08-13 DIAGNOSIS — I10 Essential (primary) hypertension: Secondary | ICD-10-CM

## 2022-08-13 HISTORY — PX: LEFT HEART CATH AND CORONARY ANGIOGRAPHY: CATH118249

## 2022-08-13 LAB — ECHOCARDIOGRAM COMPLETE
AR max vel: 2.47 cm2
AV Area VTI: 1.97 cm2
AV Area mean vel: 2.36 cm2
AV Mean grad: 3 mmHg
AV Peak grad: 5.1 mmHg
Ao pk vel: 1.13 m/s
Area-P 1/2: 2.46 cm2
Calc EF: 45.7 %
Height: 73 in
S' Lateral: 3.6 cm
Single Plane A2C EF: 40.9 %
Single Plane A4C EF: 45.4 %
Weight: 3139.35 oz

## 2022-08-13 LAB — HEPATIC FUNCTION PANEL
ALT: 31 U/L (ref 0–44)
AST: 25 U/L (ref 15–41)
Albumin: 3.4 g/dL — ABNORMAL LOW (ref 3.5–5.0)
Alkaline Phosphatase: 57 U/L (ref 38–126)
Bilirubin, Direct: 0.1 mg/dL (ref 0.0–0.2)
Indirect Bilirubin: 0.8 mg/dL (ref 0.3–0.9)
Total Bilirubin: 0.9 mg/dL (ref 0.3–1.2)
Total Protein: 6.1 g/dL — ABNORMAL LOW (ref 6.5–8.1)

## 2022-08-13 LAB — CBC
HCT: 41.5 % (ref 39.0–52.0)
Hemoglobin: 14.5 g/dL (ref 13.0–17.0)
MCH: 32.3 pg (ref 26.0–34.0)
MCHC: 34.9 g/dL (ref 30.0–36.0)
MCV: 92.4 fL (ref 80.0–100.0)
Platelets: 161 10*3/uL (ref 150–400)
RBC: 4.49 MIL/uL (ref 4.22–5.81)
RDW: 12.8 % (ref 11.5–15.5)
WBC: 9.5 10*3/uL (ref 4.0–10.5)
nRBC: 0 % (ref 0.0–0.2)

## 2022-08-13 LAB — LIPID PANEL
Cholesterol: 99 mg/dL (ref 0–200)
HDL: 30 mg/dL — ABNORMAL LOW (ref >40)
LDL Cholesterol: 59 mg/dL (ref 0–99)
Total CHOL/HDL Ratio: 3.3 RATIO
Triglycerides: 48 mg/dL (ref <150)
VLDL: 10 mg/dL (ref 0–40)

## 2022-08-13 LAB — MAGNESIUM: Magnesium: 2.1 mg/dL (ref 1.7–2.4)

## 2022-08-13 LAB — TSH: TSH: 2.149 u[IU]/mL (ref 0.350–4.500)

## 2022-08-13 LAB — BASIC METABOLIC PANEL
Anion gap: 10 (ref 5–15)
BUN: 14 mg/dL (ref 8–23)
CO2: 22 mmol/L (ref 22–32)
Calcium: 9.4 mg/dL (ref 8.9–10.3)
Chloride: 108 mmol/L (ref 98–111)
Creatinine, Ser: 0.86 mg/dL (ref 0.61–1.24)
GFR, Estimated: >60 mL/min (ref >60)
Glucose, Bld: 89 mg/dL (ref 70–99)
Potassium: 3.6 mmol/L (ref 3.5–5.1)
Sodium: 140 mmol/L (ref 135–145)

## 2022-08-13 LAB — MRSA NEXT GEN BY PCR, NASAL: MRSA by PCR Next Gen: NOT DETECTED

## 2022-08-13 LAB — HEPARIN LEVEL (UNFRACTIONATED): Heparin Unfractionated: 0.48 IU/mL (ref 0.30–0.70)

## 2022-08-13 SURGERY — LEFT HEART CATH AND CORONARY ANGIOGRAPHY
Anesthesia: LOCAL

## 2022-08-13 MED ORDER — MIDAZOLAM HCL 2 MG/2ML IJ SOLN
INTRAMUSCULAR | Status: AC
  Filled 2022-08-13: qty 2

## 2022-08-13 MED ORDER — SODIUM CHLORIDE 0.9% FLUSH: 3.0000 mL | INTRAVENOUS | Status: DC | PRN

## 2022-08-13 MED ORDER — ORAL CARE MOUTH RINSE: 15.0000 mL | OROMUCOSAL | Status: DC | PRN

## 2022-08-13 MED ORDER — SODIUM CHLORIDE 0.9% FLUSH: 3.0000 mL | Freq: Two times a day (BID) | INTRAVENOUS | Status: DC

## 2022-08-13 MED ORDER — ASPIRIN 81 MG PO CHEW: 81.0000 mg | CHEWABLE_TABLET | ORAL | Status: DC

## 2022-08-13 MED ORDER — PANTOPRAZOLE SODIUM 40 MG PO TBEC
40.0000 mg | DELAYED_RELEASE_TABLET | Freq: Every day | ORAL | Status: DC
  Administered 2022-08-13 – 2022-08-14 (×2): 40 mg via ORAL
  Filled 2022-08-13 (×2): qty 1

## 2022-08-13 MED ORDER — INFLUENZA VAC A&B SA ADJ QUAD 0.5 ML IM PRSY
0.5000 mL | PREFILLED_SYRINGE | INTRAMUSCULAR | Status: DC
  Filled 2022-08-13: qty 0.5

## 2022-08-13 MED ORDER — SODIUM CHLORIDE 0.9 % WEIGHT BASED INFUSION
3.0000 mL/kg/h | INTRAVENOUS | Status: DC
  Administered 2022-08-13: 3 mL/kg/h via INTRAVENOUS

## 2022-08-13 MED ORDER — NITROGLYCERIN 1 MG/10 ML FOR IR/CATH LAB
INTRA_ARTERIAL | Status: DC | PRN
  Administered 2022-08-13: 200 ug via INTRACORONARY

## 2022-08-13 MED ORDER — HEPARIN (PORCINE) IN NACL 1000-0.9 UT/500ML-% IV SOLN
INTRAVENOUS | Status: DC | PRN
  Administered 2022-08-13 (×2): 500 mL

## 2022-08-13 MED ORDER — VERAPAMIL HCL 2.5 MG/ML IV SOLN
INTRAVENOUS | Status: DC | PRN
  Administered 2022-08-13: 10 mL via INTRA_ARTERIAL

## 2022-08-13 MED ORDER — SODIUM CHLORIDE 0.9 % WEIGHT BASED INFUSION
1.0000 mL/kg/h | INTRAVENOUS | Status: DC
  Administered 2022-08-13: 1 mL/kg/h via INTRAVENOUS

## 2022-08-13 MED ORDER — IOHEXOL 350 MG/ML SOLN
INTRAVENOUS | Status: DC | PRN
  Administered 2022-08-13: 110 mL

## 2022-08-13 MED ORDER — TAMSULOSIN HCL 0.4 MG PO CAPS: 0.4000 mg | ORAL_CAPSULE | Freq: Every day | ORAL | Status: DC

## 2022-08-13 MED ORDER — SODIUM CHLORIDE 0.9 % WEIGHT BASED INFUSION: 1.0000 mL/kg/h | INTRAVENOUS | Status: DC

## 2022-08-13 MED ORDER — LOSARTAN POTASSIUM 25 MG PO TABS
25.0000 mg | ORAL_TABLET | Freq: Every day | ORAL | Status: DC
  Administered 2022-08-14: 25 mg via ORAL
  Filled 2022-08-13: qty 1

## 2022-08-13 MED ORDER — HYDRALAZINE HCL 20 MG/ML IJ SOLN: 10.0000 mg | INTRAMUSCULAR | Status: AC | PRN

## 2022-08-13 MED ORDER — SODIUM CHLORIDE 0.9% FLUSH
3.0000 mL | Freq: Two times a day (BID) | INTRAVENOUS | Status: DC
  Administered 2022-08-14: 3 mL via INTRAVENOUS

## 2022-08-13 MED ORDER — MIDAZOLAM HCL 2 MG/2ML IJ SOLN
INTRAMUSCULAR | Status: DC | PRN
  Administered 2022-08-13 (×3): 1 mg via INTRAVENOUS

## 2022-08-13 MED ORDER — ATORVASTATIN CALCIUM 80 MG PO TABS
80.0000 mg | ORAL_TABLET | Freq: Every day | ORAL | Status: DC
  Administered 2022-08-13 (×2): 80 mg via ORAL
  Filled 2022-08-13 (×2): qty 1

## 2022-08-13 MED ORDER — SODIUM CHLORIDE 0.9 % WEIGHT BASED INFUSION
1.0000 mL/kg/h | INTRAVENOUS | Status: AC
  Administered 2022-08-13 (×2): 1 mL/kg/h via INTRAVENOUS

## 2022-08-13 MED ORDER — SODIUM CHLORIDE 0.9 % IV SOLN: 250.0000 mL | INTRAVENOUS | Status: DC | PRN

## 2022-08-13 MED ORDER — FENTANYL CITRATE (PF) 100 MCG/2ML IJ SOLN
INTRAMUSCULAR | Status: AC
  Filled 2022-08-13: qty 2

## 2022-08-13 MED ORDER — ONDANSETRON HCL 4 MG/2ML IJ SOLN: 4.0000 mg | Freq: Four times a day (QID) | INTRAMUSCULAR | Status: DC | PRN

## 2022-08-13 MED ORDER — ASPIRIN 81 MG PO TBEC
81.0000 mg | DELAYED_RELEASE_TABLET | Freq: Every day | ORAL | Status: DC
  Administered 2022-08-13 – 2022-08-14 (×2): 81 mg via ORAL
  Filled 2022-08-13 (×2): qty 1

## 2022-08-13 MED ORDER — FENTANYL CITRATE (PF) 100 MCG/2ML IJ SOLN
INTRAMUSCULAR | Status: DC | PRN
  Administered 2022-08-13 (×3): 25 ug via INTRAVENOUS

## 2022-08-13 MED ORDER — SODIUM CHLORIDE 0.9 % WEIGHT BASED INFUSION: 3.0000 mL/kg/h | INTRAVENOUS | Status: DC

## 2022-08-13 MED ORDER — HEPARIN SODIUM (PORCINE) 1000 UNIT/ML IJ SOLN
INTRAMUSCULAR | Status: AC
  Filled 2022-08-13: qty 10

## 2022-08-13 MED ORDER — LIDOCAINE HCL (PF) 1 % IJ SOLN
INTRAMUSCULAR | Status: AC
  Filled 2022-08-13: qty 30

## 2022-08-13 MED ORDER — NITROGLYCERIN 0.4 MG SL SUBL: 0.4000 mg | SUBLINGUAL_TABLET | SUBLINGUAL | Status: DC | PRN

## 2022-08-13 MED ORDER — POTASSIUM CHLORIDE CRYS ER 20 MEQ PO TBCR
40.0000 meq | EXTENDED_RELEASE_TABLET | Freq: Once | ORAL | Status: AC
  Administered 2022-08-13: 40 meq via ORAL
  Filled 2022-08-13: qty 2

## 2022-08-13 MED ORDER — HEPARIN SODIUM (PORCINE) 1000 UNIT/ML IJ SOLN
INTRAMUSCULAR | Status: DC | PRN
  Administered 2022-08-13: 5000 [IU] via INTRAVENOUS
  Administered 2022-08-13: 4000 [IU] via INTRAVENOUS

## 2022-08-13 MED ORDER — VERAPAMIL HCL 2.5 MG/ML IV SOLN
INTRAVENOUS | Status: AC
  Filled 2022-08-13: qty 2

## 2022-08-13 MED ORDER — HEPARIN (PORCINE) IN NACL 1000-0.9 UT/500ML-% IV SOLN
INTRAVENOUS | Status: AC
  Filled 2022-08-13: qty 1000

## 2022-08-13 MED ORDER — NITROGLYCERIN 1 MG/10 ML FOR IR/CATH LAB
INTRA_ARTERIAL | Status: AC
  Filled 2022-08-13: qty 10

## 2022-08-13 MED ORDER — CARVEDILOL 6.25 MG PO TABS
6.2500 mg | ORAL_TABLET | Freq: Two times a day (BID) | ORAL | Status: DC
  Administered 2022-08-13 – 2022-08-14 (×3): 6.25 mg via ORAL
  Filled 2022-08-13 (×3): qty 1

## 2022-08-13 MED ORDER — LIDOCAINE HCL (PF) 1 % IJ SOLN
INTRAMUSCULAR | Status: DC | PRN
  Administered 2022-08-13: 2 mL

## 2022-08-13 SURGICAL SUPPLY — 17 items
CATH 5FR JL3.5 JR4 ANG PIG MP (CATHETERS) IMPLANT
CATH INFINITI 5 FR 3DRC (CATHETERS) IMPLANT
CATH INFINITI 5FR JL4 (CATHETERS) IMPLANT
CATH LAUNCHER 5F EBU4.0 (CATHETERS) IMPLANT
CATH LAUNCHER 5F RADR (CATHETERS) IMPLANT
CATH LAUNCHER 6FR EBU3.5 (CATHETERS) IMPLANT
CATHETER LAUNCHER 5F RADR (CATHETERS) ×1
DEVICE RAD COMP TR BAND LRG (VASCULAR PRODUCTS) IMPLANT
GLIDESHEATH SLEND SS 6F .021 (SHEATH) IMPLANT
GUIDEWIRE INQWIRE 1.5J.035X260 (WIRE) IMPLANT
GUIDEWIRE PRESSURE X 175 (WIRE) IMPLANT
INQWIRE 1.5J .035X260CM (WIRE) ×1
KIT ESSENTIALS PG (KITS) IMPLANT
KIT HEART LEFT (KITS) ×1 IMPLANT
PACK CARDIAC CATHETERIZATION (CUSTOM PROCEDURE TRAY) ×1 IMPLANT
TRANSDUCER W/STOPCOCK (MISCELLANEOUS) ×1 IMPLANT
TUBING CIL FLEX 10 FLL-RA (TUBING) ×1 IMPLANT

## 2022-08-13 NOTE — Progress Notes (Signed)
Patient back on floor from cath lab earlier arriving back with Heparin shut off. However still ordered on MAR. Sent two pages to heart care. Awaiting a return call to clarify.  At 1745, after releasing 2 cc of air from TR band patient began to bleed profusely. Pressure immediately held and cath lab called to bedside. Cathlab to bedside, stopped bleed. They call back to after clarifying with physician and stated air could again be released in 90 minutes, which would be 1930.

## 2022-08-13 NOTE — Progress Notes (Signed)
PROGRESS NOTE    Benjamin [name]  HAL:[id number] DOB: March 07, 1946 DOA: 08/12/2022 PCP: Pcp, No    Chief Complaint  Patient presents with   Hypertension    Brief Narrative:  Patient is a pleasant 76 year old gentleman history of CAD status post PCI to LAD in 2017, hyperlipidemia, who presented to the ED with a 2-week history of fatigue, not feeling well, episode of chest pain that he felt was secondary to indigestion with some improvement with Gas-X and Tums.  Due to ongoing symptoms, elevated blood pressure than his baseline patient presented to the ED.  EKG done with no significant signs of ischemia however high-sensitivity troponins were elevated and patient admitted for non-STEMI.  Cardiology consulted.  Patient for cardiac cath 08/13/2022.   Assessment & Plan:   Principal Problem:   NSTEMI (non-ST elevated myocardial infarction) (HCC) Active Problems:   Essential hypertension   Hypocalcemia   Hyperlipidemia   BPH (benign prostatic hyperplasia)  #1 non-STEMI -Patient presented with 2-week history of generalized fatigue, episode of chest discomfort thought secondary to ingestion as it was relieved by Tums and Gas-X. -EKG done with no significant ischemic changes noted. -High-sensitivity troponins were elevated and patient Rudin. -Fasting lipid panel with an LDL of 59, HDL of 30. -Patient placed on aspirin, statin, beta-blocker, heparin drip. -2D echo pending. -Patient seen in consultation by cardiology and patient for cardiac catheterization today. -May need to be started on ARB versus ACE inhibitor postcardiac cath however will defer to cardiology. -Appreciate cardiology input and recommendations. -We will need to follow-up with his primary cardiologist in the outpatient setting after discharge.  2.  Hypertension -On presentation patient noted to have blood pressures elevated to 164/99 with a baseline around 120/70. -Continue home regimen Coreg.  3.   Hypocalcemia -Status post 2 g IV calcium gluconate. -Calcium at 9.4.  4.  Hyperlipidemia -LDL of 59. -Continue home regimen of high-dose statin atorvastatin 80 mg daily.  5.  BPH -Flomax   DVT prophylaxis: Heparin Code Status: Full Family Communication: Updated patient and wife at bedside. Disposition: Likely home post cath and when cleared by cardiology.  Status is: Observation The patient remains OBS appropriate and will d/c before 2 midnights.   Consultants:  Cardiology: Dr.Narcisse 08/12/2022  Procedures:  Chest x-ray 08/12/2022   Antimicrobials:  None   Subjective: No CP. No SOB. Left shoulder discomfort.  States he is hungry.  Objective: Vitals:   08/12/22 2246 08/13/22 0300 08/13/22 0407 08/13/22 0800  BP: (!) 149/92 135/74 135/76 (!) 143/98  Pulse: 60 (!) 56 72 60  Resp: 19 15 15 10   Temp: (!) 97.4 F (36.3 C)  97.8 F (36.6 C) 97.9 F (36.6 C)  TempSrc: Oral  Oral Oral  SpO2: 94% 92% 91% (!) 87%  Weight:      Height:        Intake/Output Summary (Last 24 hours) at 08/13/2022 0917 Last data filed at 08/13/2022 0800 Gross per 24 hour  Intake 299.65 ml  Output 775 ml  Net -475.35 ml   Filed Weights   08/12/22 1413 08/12/22 2225  Weight: 88.9 kg 89 kg    Examination:  General exam: Appears calm and comfortable  Respiratory system: Clear to auscultation.  No wheezes, no crackles, no rhonchi.  Respiratory effort normal. Cardiovascular system: S1 & S2 heard, RRR. No JVD, murmurs, rubs, gallops or clicks. No pedal edema. Gastrointestinal system: Abdomen is nondistended, soft and nontender. No organomegaly or masses felt. Normal bowel sounds heard. Central nervous  system: Alert and oriented. No focal neurological deficits. Extremities: Symmetric 5 x 5 power. Skin: No rashes, lesions or ulcers Psychiatry: Judgement and insight appear normal. Mood & affect appropriate.     Data Reviewed: I have personally reviewed following labs and imaging  studies  CBC: Recent Labs  Lab 08/12/22 1439 08/13/22 0059  WBC 7.8 9.5  NEUTROABS 5.0  --   HGB 14.5 14.5  HCT 41.9 41.5  MCV 92.3 92.4  PLT 168 161    Basic Metabolic Panel: Recent Labs  Lab 08/12/22 1439 08/13/22 0059  NA 138 140  K 3.7 3.6  CL 110 108  CO2 22 22  GLUCOSE 128* 89  BUN 17 14  CREATININE 0.91 0.86  CALCIUM 8.3* 9.4  MG  --  2.1    GFR: Estimated Creatinine Clearance: 82.6 mL/min (by C-G formula based on SCr of 0.86 mg/dL).  Liver Function Tests: Recent Labs  Lab 08/12/22 1439 08/13/22 0059  AST 26 25  ALT 32 31  ALKPHOS 70 57  BILITOT 0.6 0.9  PROT 6.6 6.1*  ALBUMIN 3.6 3.4*    CBG: No results for input(s): "GLUCAP" in the last 168 hours.   Recent Results (from the past 240 hour(s))  MRSA Next Gen by PCR, Nasal     Status: None   Collection Time: 08/12/22 10:48 PM   Specimen: Nasal Mucosa; Nasal Swab  Result Value Ref Range Status   MRSA by PCR Next Gen NOT DETECTED NOT DETECTED Final    Comment: (NOTE) The GeneXpert MRSA Assay (FDA approved for NASAL specimens only), is one component of a comprehensive MRSA colonization surveillance program. It is not intended to diagnose MRSA infection nor to guide or monitor treatment for MRSA infections. Test performance is not FDA approved in patients less than 34 years old. Performed at [facility], [address] [place], [place] [address]          Radiology Studies: DG Chest Portable 1 View  Result Date: 08/12/2022 CLINICAL DATA:  Fatigue EXAM: PORTABLE CHEST 1 VIEW COMPARISON:  Chest radiograph 03/12/2022 FINDINGS: The cardiomediastinal silhouette is within normal limits. Mild linear opacities in the lung bases likely reflect subsegmental atelectasis. There is no focal consolidation or pulmonary edema. There is no pleural effusion or pneumothorax There is no acute osseous abnormality. IMPRESSION: Probable bibasilar subsegmental atelectasis. Otherwise, no radiographic  evidence of acute cardiopulmonary process. Electronically Signed   By: Lesia Hausen M.D.   On: 08/12/2022 14:54        Scheduled Meds:  aspirin EC  81 mg Oral Daily   atorvastatin  80 mg Oral QHS   carvedilol  6.25 mg Oral BID   influenza vaccine adjuvanted  0.5 mL Intramuscular Tomorrow-1000   pantoprazole  40 mg Oral Daily   sodium chloride flush  3 mL Intravenous Q12H   [START ON 08/14/2022] tamsulosin  0.4 mg Oral QHS   Continuous Infusions:  heparin 1,200 Units/hr (08/13/22 0404)     LOS: 0 days    Time spent: 40 minutes    Ramiro Harvest, MD [facility]   To contact the attending provider between 7A-7P or the covering provider during after hours 7P-7A, please log into the web site [email or web address] and access using universal [place] password for that web site. If you do not have the password, please call the hospital operator.  08/13/2022, 9:17 AM

## 2022-08-13 NOTE — Plan of Care (Signed)

## 2022-08-13 NOTE — H&P (View-Only) (Signed)
Rounding Note    Patient Name: Benjamin [name] Date of Encounter: 08/13/2022  [facility] Cardiologist: None Dr. Heide Scales, [facility]  Subjective   Currently denies chest pain or shortness of breath.  His only discomfort is in his left shoulder which has been a chronic issue.  Reports that with his prior MIs he had chest pressure in 2010 and chest pressure radiating to his shoulder and jaw in 2017.  He is active playing tennis and running and has not had any exertional symptoms lately but has felt generally unwell for the last 2 weeks.  No other localizing symptoms.  Inpatient Medications    Scheduled Meds:  aspirin EC  81 mg Oral Daily   atorvastatin  80 mg Oral QHS   carvedilol  6.25 mg Oral BID   [START ON 08/14/2022] influenza vaccine adjuvanted  0.5 mL Intramuscular Tomorrow-1000   pantoprazole  40 mg Oral Daily   sodium chloride flush  3 mL Intravenous Q12H   [START ON 08/14/2022] tamsulosin  0.4 mg Oral QHS   Continuous Infusions:  heparin 1,200 Units/hr (08/13/22 0404)   PRN Meds: acetaminophen **OR** acetaminophen, albuterol, nitroGLYCERIN, mouth rinse   Vital Signs    Vitals:   08/12/22 2246 08/13/22 0300 08/13/22 0407 08/13/22 0800  BP: (!) 149/92 135/74 135/76 (!) 143/98  Pulse: 60 (!) 56 72 60  Resp: 19 15 15 10   Temp: (!) 97.4 F (36.3 C)  97.8 F (36.6 C) 97.9 F (36.6 C)  TempSrc: Oral  Oral Oral  SpO2: 94% 92% 91% (!) 87%  Weight:      Height:        Intake/Output Summary (Last 24 hours) at 08/13/2022 0946 Last data filed at 08/13/2022 0800 Gross per 24 hour  Intake 299.65 ml  Output 775 ml  Net -475.35 ml      08/12/2022   10:25 PM 08/12/2022    2:13 PM  Last 3 Weights  Weight (lbs) 196 lb 3.4 oz 196 lb  Weight (kg) 89 kg 88.905 kg      Telemetry    Sinus rhythm, sinus bradycardia- Personally Reviewed  ECG     Sinus rhythm.  Rate 62 bpm.  Low voltage.- Personally Reviewed  Physical Exam   VS:  BP (!) 143/98 (BP  Location: Right Arm)   Pulse 60   Temp 97.9 F (36.6 C) (Oral)   Resp 10   Ht 6\' 1"  (1.854 m)   Wt 89 kg   SpO2 (!) 87%   BMI 25.89 kg/m  , BMI Body mass index is 25.89 kg/m. GENERAL:  Well appearing HEENT: Pupils equal round and reactive, fundi not visualized, oral mucosa unremarkable NECK:  No jugular venous distention, waveform within normal limits, carotid upstroke brisk and symmetric, no bruits, no thyromegaly LUNGS:  Clear to auscultation bilaterally HEART:  RRR.  PMI not displaced or sustained,S1 and S2 within normal limits, no S3, no S4, no clicks, no rubs, no murmurs ABD:  Flat, positive bowel sounds normal in frequency in pitch, no bruits, no rebound, no guarding, no midline pulsatile mass, no hepatomegaly, no splenomegaly EXT:  2 plus pulses throughout, no edema, no cyanosis no clubbing SKIN:  No rashes no nodules NEURO:  Cranial nerves II through XII grossly intact, motor grossly intact throughout PSYCH:  Cognitively intact, oriented to person place and time   Labs    High Sensitivity Troponin:   Recent Labs  Lab 08/12/22 1440 08/12/22 1706 08/12/22 2042 08/12/22  2234  TROPONINIHS 344* 410* 231* 423*     Chemistry Recent Labs  Lab 08/12/22 1439 08/13/22 0059  NA 138 140  K 3.7 3.6  CL 110 108  CO2 22 22  GLUCOSE 128* 89  BUN 17 14  CREATININE 0.91 0.86  CALCIUM 8.3* 9.4  MG  --  2.1  PROT 6.6 6.1*  ALBUMIN 3.6 3.4*  AST 26 25  ALT 32 31  ALKPHOS 70 57  BILITOT 0.6 0.9  GFRNONAA >60 >60  ANIONGAP 6 10    Lipids  Recent Labs  Lab 08/13/22 0059  CHOL 99  TRIG 48  HDL 30*  LDLCALC 59  CHOLHDL 3.3    Hematology Recent Labs  Lab 08/12/22 1439 08/13/22 0059  WBC 7.8 9.5  RBC 4.54 4.49  HGB 14.5 14.5  HCT 41.9 41.5  MCV 92.3 92.4  MCH 31.9 32.3  MCHC 34.6 34.9  RDW 12.8 12.8  PLT 168 161   Thyroid  Recent Labs  Lab 08/13/22 0059  TSH 2.149    BNPNo results for input(s): "BNP", "PROBNP" in the last 168 hours.  DDimer No  results for input(s): "DDIMER" in the last 168 hours.   Radiology    DG Chest Portable 1 View  Result Date: 08/12/2022 CLINICAL DATA:  Fatigue EXAM: PORTABLE CHEST 1 VIEW COMPARISON:  Chest radiograph 03/12/2022 FINDINGS: The cardiomediastinal silhouette is within normal limits. Mild linear opacities in the lung bases likely reflect subsegmental atelectasis. There is no focal consolidation or pulmonary edema. There is no pleural effusion or pneumothorax There is no acute osseous abnormality. IMPRESSION: Probable bibasilar subsegmental atelectasis. Otherwise, no radiographic evidence of acute cardiopulmonary process. Electronically Signed   By: Peter  Noone M.D.   On: 08/12/2022 14:54    Cardiac Studies   Unavailable to view  Patient Profile     76 y.o. male with CAD s/p PCI and hyperlipidemia admitted with NSTEMI.  Assessment & Plan    # NSTEMI:  # Hyperlipidemia: Patient reports feeling generally unwell and fatigued for the last 2 weeks.  He has not had any chest pressure which is somewhat different than his prior MIs.  However since symptoms of been somewhat nonspecific with his other MIs as well.  He has a history of an LAD PCI in 2017.  Unsure where his PCI was in 2010.  High-sensitivity troponin elevated to the 200s to 400s.  Plan for left heart cath today.  He is n.p.o. and in agreement with this plan.  Lipids are very well controlled on atorvastatin.  Check LP(a).  Shared Decision Making/Informed Consent The risks [stroke (1 in 1000), death (1 in 1000), kidney failure [usually temporary] (1 in 500), bleeding (1 in 200), allergic reaction [possibly serious] (1 in 200)], benefits (diagnostic support and management of coronary artery disease) and alternatives of a cardiac catheterization were discussed in detail with Benjamin [name] and he is willing to proceed.   For questions or updates, please contact [facility] Please consult [email or web address] for contact info under         Signed, Mikala Podoll Gaylord, MD  08/13/2022, 9:46 AM    

## 2022-08-13 NOTE — Progress Notes (Signed)
ANTICOAGULATION CONSULT NOTE  Pharmacy Consult for heparin Indication: chest pain/ACS  Allergies  Allergen Reactions   Penicillins Hives and Rash    Patient Measurements: Height: 6\' 1"  (185.4 cm) Weight: 89 kg (196 lb 3.4 oz) IBW/kg (Calculated) : 79.9 Heparin Dosing Weight: 88.9kg  Vital Signs: Temp: 97.4 F (36.3 C) (09/13 2246) Temp Source: Oral (09/13 2246) BP: 149/92 (09/13 2246) Pulse Rate: 60 (09/13 2246)  Labs: Recent Labs    08/12/22 1439 08/12/22 1440 08/12/22 1706 08/12/22 2042 08/12/22 2234 08/13/22 0059  HGB 14.5  --   --   --   --  14.5  HCT 41.9  --   --   --   --  41.5  PLT 168  --   --   --   --  161  HEPARINUNFRC  --   --   --   --   --  0.48  CREATININE 0.91  --   --   --   --   --   TROPONINIHS  --    < > 410* 231* 423*  --    < > = values in this interval not displayed.     Estimated Creatinine Clearance: 78 mL/min (by C-G formula based on SCr of 0.91 mg/dL).   Medical History: Past Medical History:  Diagnosis Date   Coronary artery disease     Medications:  Medications Prior to Admission  Medication Sig Dispense Refill Last Dose   Alpha-Lipoic Acid (LIPOIC ACID PO) Take 1 capsule by mouth daily.   08/12/2022   aspirin EC 81 MG tablet Take 81 mg by mouth daily.   08/12/2022   atorvastatin (LIPITOR) 80 MG tablet Take 80 mg by mouth at bedtime.   08/11/2022   carvedilol (COREG) 6.25 MG tablet Take 6.25 mg by mouth 2 (two) times daily.   08/12/2022 at 0630   Cholecalciferol 50 MCG (2000 UT) CAPS Take 2,000 Units by mouth daily.   08/12/2022   Cyanocobalamin (VITAMIN B 12 PO) Take 1 tablet by mouth daily.   08/12/2022   Multiple Vitamins-Minerals (MULTIVITAMIN WITH MINERALS) tablet Take 1 tablet by mouth daily. One a Day Mens   08/12/2022   Omega-3 1000 MG CAPS Take 1,000 mg by mouth daily.   08/12/2022   pantoprazole (PROTONIX) 40 MG tablet Take 40 mg by mouth daily.   08/12/2022   tamsulosin (FLOMAX) 0.4 MG CAPS capsule Take 0.4 mg by mouth at  bedtime.   08/11/2022   Scheduled:   aspirin EC  81 mg Oral Daily   atorvastatin  80 mg Oral QHS   carvedilol  6.25 mg Oral BID   influenza vaccine adjuvanted  0.5 mL Intramuscular Tomorrow-1000   pantoprazole  40 mg Oral Daily   sodium chloride flush  3 mL Intravenous Q12H   [START ON 08/14/2022] tamsulosin  0.4 mg Oral QHS    Assessment: 14 yoM with PMH of CAD who presented with high blood pressure and fatigue. Pharmacy consulted to dose heparin for ACS/NSTEMI. Troponins elevated at 344 with CBC stable. No PTA anticoagulation.  Initial heparin level is therapeutic, planning for cardiac cath later today.  Goal of Therapy:  Heparin level 0.3-0.7 units/ml Monitor platelets by anticoagulation protocol: Yes   Plan:  Continue heparin 1200 units/h Daily heparin level and CBC  73, PharmD, BCPS, [facility] Clinical Pharmacist Please check AMION for all [facility] numbers 08/13/2022

## 2022-08-13 NOTE — Interval H&P Note (Signed)
History and Physical Interval Note:  08/13/2022 2:17 PM  Benjamin [name]  has presented today for surgery, with the diagnosis of nstemi.  The various methods of treatment have been discussed with the patient and family. After consideration of risks, benefits and other options for treatment, the patient has consented to  Procedure(s): LEFT HEART CATH AND CORONARY ANGIOGRAPHY (N/A) as a surgical intervention.  The patient's history has been reviewed, patient examined, no change in status, stable for surgery.  I have reviewed the patient's chart and labs.  Questions were answered to the patient's satisfaction.   Cath Lab Visit (complete for each Cath Lab visit)  Clinical Evaluation Leading to the Procedure:   ACS: Yes.    Non-ACS:    Anginal Classification: CCS [name]  Anti-ischemic medical therapy: Minimal Therapy (1 class of medications)  Non-Invasive Test Results: No non-invasive testing performed  Prior CABG: No previous CABG        Benjamin [name] [facility] 08/13/2022 2:18 PM

## 2022-08-13 NOTE — Plan of Care (Signed)
  Problem: Education: Goal: Knowledge of General Education information will improve Description: Including pain rating scale, medication(s)/side effects and non-pharmacologic comfort measures Outcome: Progressing   Problem: Health Behavior/Discharge Planning: Goal: Ability to manage health-related needs will improve Outcome: Progressing   Problem: Clinical Measurements: Goal: Diagnostic test results will improve Outcome: Progressing Goal: Cardiovascular complication will be avoided Outcome: Progressing   Problem: Pain Managment: Goal: General experience of comfort will improve Outcome: Progressing

## 2022-08-13 NOTE — Progress Notes (Signed)
Mobility Specialist Progress Note    08/13/22 1118  Mobility  Activity Ambulated independently in hallway  Level of Assistance Standby assist, set-up cues, supervision of patient - no hands on  Assistive Device None  Distance Ambulated (ft) 420 ft  Activity Response Tolerated well  $Mobility charge 1 Mobility   Pre-Mobility: 53 HR, 132/112 BP, 93% SpO2 During Mobility: 82 HR, 92% SpO2 on RA Post-Mobility: 54 HR, 155/90 BP, 92% SpO2 on RA  Pt received in bed and agreeable. No complaints on walk. Maintained SpO2 90-94% on RA. Returned to bed with call bell in reach. RN advised to leave on RA.  Sheldon Nation Mobility Specialist

## 2022-08-13 NOTE — Progress Notes (Signed)
Called to 2C10 to assess a actively bleeding TR band, advised RN to add 2CC's air to stop the bleeding while we were headed to assess. Arrived with Orene Desanctis to assess, found that the [facility] RN was holding pressure with active bleeding still present and unable to add air due to holding pressure, manual pressure was taken over and 2 CC's of air was added. Band still slightly bleeding, band adjusted with manual pressure being held with BP cuff. Band was not clear and site could not be assessed due to residual blood. A new band was then applied with 15CC's of air without bleeding present. Palpable pulse distal to the band. We advised Shanda Bumps RN per Dr.Jordan wait 90 minutes to began re deflation.

## 2022-08-13 NOTE — Progress Notes (Addendum)
Rounding Note    Patient Name: Benjamin [name] Date of Encounter: 08/13/2022  [facility] Cardiologist: None Dr. Heide Scales, [facility]  Subjective   Currently denies chest pain or shortness of breath.  His only discomfort is in his left shoulder which has been a chronic issue.  Reports that with his prior MIs he had chest pressure in 2010 and chest pressure radiating to his shoulder and jaw in 2017.  He is active playing tennis and running and has not had any exertional symptoms lately but has felt generally unwell for the last 2 weeks.  No other localizing symptoms.  Inpatient Medications    Scheduled Meds:  aspirin EC  81 mg Oral Daily   atorvastatin  80 mg Oral QHS   carvedilol  6.25 mg Oral BID   [START ON 08/14/2022] influenza vaccine adjuvanted  0.5 mL Intramuscular Tomorrow-1000   pantoprazole  40 mg Oral Daily   sodium chloride flush  3 mL Intravenous Q12H   [START ON 08/14/2022] tamsulosin  0.4 mg Oral QHS   Continuous Infusions:  heparin 1,200 Units/hr (08/13/22 0404)   PRN Meds: acetaminophen **OR** acetaminophen, albuterol, nitroGLYCERIN, mouth rinse   Vital Signs    Vitals:   08/12/22 2246 08/13/22 0300 08/13/22 0407 08/13/22 0800  BP: (!) 149/92 135/74 135/76 (!) 143/98  Pulse: 60 (!) 56 72 60  Resp: 19 15 15 10   Temp: (!) 97.4 F (36.3 C)  97.8 F (36.6 C) 97.9 F (36.6 C)  TempSrc: Oral  Oral Oral  SpO2: 94% 92% 91% (!) 87%  Weight:      Height:        Intake/Output Summary (Last 24 hours) at 08/13/2022 0946 Last data filed at 08/13/2022 0800 Gross per 24 hour  Intake 299.65 ml  Output 775 ml  Net -475.35 ml      08/12/2022   10:25 PM 08/12/2022    2:13 PM  Last 3 Weights  Weight (lbs) 196 lb 3.4 oz 196 lb  Weight (kg) 89 kg 88.905 kg      Telemetry    Sinus rhythm, sinus bradycardia- Personally Reviewed  ECG     Sinus rhythm.  Rate 62 bpm.  Low voltage.- Personally Reviewed  Physical Exam   VS:  BP (!) 143/98 (BP  Location: Right Arm)   Pulse 60   Temp 97.9 F (36.6 C) (Oral)   Resp 10   Ht 6\' 1"  (1.854 m)   Wt 89 kg   SpO2 (!) 87%   BMI 25.89 kg/m  , BMI Body mass index is 25.89 kg/m. GENERAL:  Well appearing HEENT: Pupils equal round and reactive, fundi not visualized, oral mucosa unremarkable NECK:  No jugular venous distention, waveform within normal limits, carotid upstroke brisk and symmetric, no bruits, no thyromegaly LUNGS:  Clear to auscultation bilaterally HEART:  RRR.  PMI not displaced or sustained,S1 and S2 within normal limits, no S3, no S4, no clicks, no rubs, no murmurs ABD:  Flat, positive bowel sounds normal in frequency in pitch, no bruits, no rebound, no guarding, no midline pulsatile mass, no hepatomegaly, no splenomegaly EXT:  2 plus pulses throughout, no edema, no cyanosis no clubbing SKIN:  No rashes no nodules NEURO:  Cranial nerves II through XII grossly intact, motor grossly intact throughout PSYCH:  Cognitively intact, oriented to person place and time   Labs    High Sensitivity Troponin:   Recent Labs  Lab 08/12/22 1440 08/12/22 1706 08/12/22 2042 08/12/22  2234  TROPONINIHS 344* 410* 231* 423*     Chemistry Recent Labs  Lab 08/12/22 1439 08/13/22 0059  NA 138 140  K 3.7 3.6  CL 110 108  CO2 22 22  GLUCOSE 128* 89  BUN 17 14  CREATININE 0.91 0.86  CALCIUM 8.3* 9.4  MG  --  2.1  PROT 6.6 6.1*  ALBUMIN 3.6 3.4*  AST 26 25  ALT 32 31  ALKPHOS 70 57  BILITOT 0.6 0.9  GFRNONAA >60 >60  ANIONGAP 6 10    Lipids  Recent Labs  Lab 08/13/22 0059  CHOL 99  TRIG 48  HDL 30*  LDLCALC 59  CHOLHDL 3.3    Hematology Recent Labs  Lab 08/12/22 1439 08/13/22 0059  WBC 7.8 9.5  RBC 4.54 4.49  HGB 14.5 14.5  HCT 41.9 41.5  MCV 92.3 92.4  MCH 31.9 32.3  MCHC 34.6 34.9  RDW 12.8 12.8  PLT 168 161   Thyroid  Recent Labs  Lab 08/13/22 0059  TSH 2.149    BNPNo results for input(s): "BNP", "PROBNP" in the last 168 hours.  DDimer No  results for input(s): "DDIMER" in the last 168 hours.   Radiology    DG Chest Portable 1 View  Result Date: 08/12/2022 CLINICAL DATA:  Fatigue EXAM: PORTABLE CHEST 1 VIEW COMPARISON:  Chest radiograph 03/12/2022 FINDINGS: The cardiomediastinal silhouette is within normal limits. Mild linear opacities in the lung bases likely reflect subsegmental atelectasis. There is no focal consolidation or pulmonary edema. There is no pleural effusion or pneumothorax There is no acute osseous abnormality. IMPRESSION: Probable bibasilar subsegmental atelectasis. Otherwise, no radiographic evidence of acute cardiopulmonary process. Electronically Signed   By: Lesia Hausen M.D.   On: 08/12/2022 14:54    Cardiac Studies   Unavailable to view  Patient Profile     76 y.o. male with CAD s/p PCI and hyperlipidemia admitted with NSTEMI.  Assessment & Plan    # NSTEMI:  # Hyperlipidemia: Patient reports feeling generally unwell and fatigued for the last 2 weeks.  He has not had any chest pressure which is somewhat different than his prior MIs.  However since symptoms of been somewhat nonspecific with his other MIs as well.  He has a history of an LAD PCI in 2017.  Unsure where his PCI was in 2010.  High-sensitivity troponin elevated to the 200s to 400s.  Plan for left heart cath today.  He is n.p.o. and in agreement with this plan.  Lipids are very well controlled on atorvastatin.  Check LP(a).  Shared Decision Making/Informed Consent The risks [stroke (1 in 1000), death (1 in 1000), kidney failure [usually temporary] (1 in 500), bleeding (1 in 200), allergic reaction [possibly serious] (1 in 200)], benefits (diagnostic support and management of coronary artery disease) and alternatives of a cardiac catheterization were discussed in detail with Benjamin [name] and he is willing to proceed.   For questions or updates, please contact [facility] Please consult [email or web address] for contact info under         Signed, Chilton Si, MD  08/13/2022, 9:46 AM

## 2022-08-14 ENCOUNTER — Encounter (HOSPITAL_COMMUNITY): Payer: Self-pay | Admitting: Cardiology

## 2022-08-14 DIAGNOSIS — N400 Enlarged prostate without lower urinary tract symptoms: Secondary | ICD-10-CM | POA: Diagnosis not present

## 2022-08-14 DIAGNOSIS — I248 Other forms of acute ischemic heart disease: Secondary | ICD-10-CM | POA: Diagnosis not present

## 2022-08-14 DIAGNOSIS — I214 Non-ST elevation (NSTEMI) myocardial infarction: Secondary | ICD-10-CM | POA: Diagnosis not present

## 2022-08-14 DIAGNOSIS — E7800 Pure hypercholesterolemia, unspecified: Secondary | ICD-10-CM | POA: Diagnosis not present

## 2022-08-14 DIAGNOSIS — I5020 Unspecified systolic (congestive) heart failure: Secondary | ICD-10-CM

## 2022-08-14 DIAGNOSIS — I10 Essential (primary) hypertension: Secondary | ICD-10-CM | POA: Diagnosis not present

## 2022-08-14 LAB — BASIC METABOLIC PANEL
Anion gap: 9 (ref 5–15)
BUN: 13 mg/dL (ref 8–23)
CO2: 24 mmol/L (ref 22–32)
Calcium: 9.2 mg/dL (ref 8.9–10.3)
Chloride: 106 mmol/L (ref 98–111)
Creatinine, Ser: 0.95 mg/dL (ref 0.61–1.24)
GFR, Estimated: >60 mL/min (ref >60)
Glucose, Bld: 153 mg/dL — ABNORMAL HIGH (ref 70–99)
Potassium: 3.7 mmol/L (ref 3.5–5.1)
Sodium: 139 mmol/L (ref 135–145)

## 2022-08-14 LAB — MAGNESIUM: Magnesium: 2 mg/dL (ref 1.7–2.4)

## 2022-08-14 LAB — CBC
HCT: 44.2 % (ref 39.0–52.0)
Hemoglobin: 15 g/dL (ref 13.0–17.0)
MCH: 31.6 pg (ref 26.0–34.0)
MCHC: 33.9 g/dL (ref 30.0–36.0)
MCV: 93.1 fL (ref 80.0–100.0)
Platelets: 174 10*3/uL (ref 150–400)
RBC: 4.75 MIL/uL (ref 4.22–5.81)
RDW: 12.9 % (ref 11.5–15.5)
WBC: 10.8 10*3/uL — ABNORMAL HIGH (ref 4.0–10.5)
nRBC: 0 % (ref 0.0–0.2)

## 2022-08-14 MED ORDER — LOSARTAN POTASSIUM 25 MG PO TABS: 25.0000 mg | ORAL_TABLET | Freq: Every day | ORAL | 1 refills | Status: AC

## 2022-08-14 NOTE — Progress Notes (Signed)
TR Band removed from patients R Radial site remains level zero. Patient's radial pulse 2+ and patient can move all fingers. Patient voiced no complaints.

## 2022-08-14 NOTE — Progress Notes (Signed)
Discharge instructions provided. RN answered all questions and patient verbalized understanding of instructions. Both IV's removed. Patient discharged by private vehicle.

## 2022-08-14 NOTE — Care Management Obs Status (Signed)
MEDICARE OBSERVATION STATUS NOTIFICATION   Patient Details  Name: Benjamin [name] MRN: [id number] Date of Birth: 12/18/1945   Medicare Observation Status Notification Given:  Yes    Beckie Busing, RN 08/14/2022, 8:52 AM

## 2022-08-14 NOTE — Progress Notes (Signed)
Air from TR band on patient R radial is slowly being removed by 2cc every 30 minutes. Watching closely for bleeding events. Patient stated he will not move or flex his wrist. After all air is removed I told patient I would wrap his wrist with Kerlix as a reminder while he is sleeping to not flex his wrist. Watching patient closely.

## 2022-08-14 NOTE — Progress Notes (Signed)
Rounding Note    Patient Name: Benjamin [name] Date of Encounter: 08/14/2022  [facility] Cardiologist: None Dr. Heide Scales, [facility]  Subjective   Feeling well.  Denies chest pain.  Inpatient Medications    Scheduled Meds:  aspirin EC  81 mg Oral Daily   atorvastatin  80 mg Oral QHS   carvedilol  6.25 mg Oral BID   influenza vaccine adjuvanted  0.5 mL Intramuscular Tomorrow-1000   losartan  25 mg Oral Daily   pantoprazole  40 mg Oral Daily   sodium chloride flush  3 mL Intravenous Q12H   sodium chloride flush  3 mL Intravenous Q12H   tamsulosin  0.4 mg Oral QHS   Continuous Infusions:  sodium chloride     PRN Meds: sodium chloride, acetaminophen **OR** acetaminophen, albuterol, nitroGLYCERIN, ondansetron (ZOFRAN) IV, mouth rinse, sodium chloride flush   Vital Signs    Vitals:   08/14/22 0319 08/14/22 0400 08/14/22 0545 08/14/22 0739  BP:  121/67    Pulse: (!) 59 60 73   Resp: 16 14 19    Temp: (!) 97.5 F (36.4 C)   97.9 F (36.6 C)  TempSrc: Oral   Oral  SpO2: 93% 94% 96%   Weight:   86.6 kg   Height:        Intake/Output Summary (Last 24 hours) at 08/14/2022 0906 Last data filed at 08/14/2022 0857 Gross per 24 hour  Intake 366 ml  Output 1475 ml  Net -1109 ml      08/14/2022    5:45 AM 08/13/2022   12:25 PM 08/12/2022   10:25 PM  Last 3 Weights  Weight (lbs) 190 lb 14.7 oz 196 lb 3.4 oz 196 lb 3.4 oz  Weight (kg) 86.6 kg 89 kg 89 kg      Telemetry    Sinus rhythm.  No events personally Reviewed  ECG     Sinus rhythm.  Rate 62 bpm.  Low voltage.- Personally Reviewed  Physical Exam   VS:  BP 121/67 (BP Location: Right Arm)   Pulse 73   Temp 97.9 F (36.6 C) (Oral)   Resp 19   Ht 6\' 1"  (1.854 m)   Wt 86.6 kg   SpO2 96%   BMI 25.19 kg/m  , BMI Body mass index is 25.19 kg/m. GENERAL:  Well appearing HEENT: Pupils equal round and reactive, fundi not visualized, oral mucosa unremarkable NECK:  No jugular venous  distention, waveform within normal limits, carotid upstroke brisk and symmetric, no bruits, no thyromegaly LUNGS:  Clear to auscultation bilaterally HEART:  RRR.  PMI not displaced or sustained,S1 and S2 within normal limits, no S3, no S4, no clicks, no rubs, no murmurs ABD:  Flat, positive bowel sounds normal in frequency in pitch, no bruits, no rebound, no guarding, no midline pulsatile mass, no hepatomegaly, no splenomegaly EXT:  2 plus pulses throughout, no edema, no cyanosis no clubbing.  Right radial cath site stable. SKIN:  No rashes no nodules NEURO:  Cranial nerves II through XII grossly intact, motor grossly intact throughout PSYCH:  Cognitively intact, oriented to person place and time   Labs    High Sensitivity Troponin:   Recent Labs  Lab 08/12/22 1440 08/12/22 1706 08/12/22 2042 08/12/22 2234  TROPONINIHS 344* 410* 231* 423*     Chemistry Recent Labs  Lab 08/12/22 1439 08/13/22 0059  NA 138 140  K 3.7 3.6  CL 110 108  CO2 22 22  GLUCOSE 128* 89  BUN 17 14  CREATININE 0.91 0.86  CALCIUM 8.3* 9.4  MG  --  2.1  PROT 6.6 6.1*  ALBUMIN 3.6 3.4*  AST 26 25  ALT 32 31  ALKPHOS 70 57  BILITOT 0.6 0.9  GFRNONAA >60 >60  ANIONGAP 6 10    Lipids  Recent Labs  Lab 08/13/22 0059  CHOL 99  TRIG 48  HDL 30*  LDLCALC 59  CHOLHDL 3.3    Hematology Recent Labs  Lab 08/12/22 1439 08/13/22 0059 08/14/22 0759  WBC 7.8 9.5 10.8*  RBC 4.54 4.49 4.75  HGB 14.5 14.5 15.0  HCT 41.9 41.5 44.2  MCV 92.3 92.4 93.1  MCH 31.9 32.3 31.6  MCHC 34.6 34.9 33.9  RDW 12.8 12.8 12.9  PLT 168 161 174   Thyroid  Recent Labs  Lab 08/13/22 0059  TSH 2.149    BNPNo results for input(s): "BNP", "PROBNP" in the last 168 hours.  DDimer No results for input(s): "DDIMER" in the last 168 hours.   Radiology    ECHOCARDIOGRAM COMPLETE  Result Date: 08/13/2022    ECHOCARDIOGRAM REPORT   Patient Name:   Benjamin [name] [facility] [name] Date of Exam: 08/13/2022 Medical Rec #:  [id number]           Height:       73.0 in Accession #:    [id number]         Weight:       196.2 lb Date of Birth:  09-04-1946          BSA:          2.134 m Patient Age:    76 years           BP:           135/76 mmHg Patient Gender: M                  HR:           61 bpm. Exam Location:  Inpatient Procedure: 2D Echo, Color Doppler and Cardiac Doppler Indications:    Elevated Troponin  History:        Patient has no prior history of Echocardiogram examinations.                 CAD.  Sonographer:    Memory Argue Referring Phys: [id number] RONDELL A SMITH IMPRESSIONS  1. Left ventricular ejection fraction, by estimation, is 45 to 50%. Left ventricular ejection fraction by 2D MOD biplane is 45.7 %. The left ventricle has mildly decreased function. The left ventricle demonstrates regional wall motion abnormalities (see  scoring diagram/findings for description). Left ventricular diastolic parameters are consistent with Grade I diastolic dysfunction (impaired relaxation). There is mild hypokinesis of the left ventricular, basal-mid inferoseptal wall and inferior wall.  2. Right ventricular systolic function is low normal. The right ventricular size is normal. Tricuspid regurgitation signal is inadequate for assessing PA pressure.  3. Left atrial size was moderately dilated.  4. The mitral valve is grossly normal. Trivial mitral valve regurgitation.  5. The aortic valve is tricuspid. Aortic valve regurgitation is not visualized.  6. Aortic dilatation noted. There is borderline dilatation of the aortic root, measuring 39 mm. Comparison(s): No prior Echocardiogram. FINDINGS  Left Ventricle: Left ventricular ejection fraction, by estimation, is 45 to 50%. Left ventricular ejection fraction by 2D MOD biplane is 45.7 %. The left ventricle has mildly decreased function. The left ventricle demonstrates regional wall motion abnormalities. Mild hypokinesis of the left ventricular,  basal-mid inferoseptal wall and inferior wall. The left  ventricular internal cavity size was normal in size. There is no left ventricular hypertrophy. Left ventricular diastolic parameters are consistent with Grade I diastolic dysfunction (impaired relaxation). Indeterminate filling pressures.  LV Wall Scoring: The basal inferior segment and basal inferoseptal segment are hypokinetic. Right Ventricle: The right ventricular size is normal. No increase in right ventricular wall thickness. Right ventricular systolic function is low normal. Tricuspid regurgitation signal is inadequate for assessing PA pressure. Left Atrium: Left atrial size was moderately dilated. Right Atrium: Right atrial size was normal in size. Pericardium: There is no evidence of pericardial effusion. Mitral Valve: The mitral valve is grossly normal. Trivial mitral valve regurgitation. Tricuspid Valve: The tricuspid valve is grossly normal. Tricuspid valve regurgitation is trivial. Aortic Valve: The aortic valve is tricuspid. Aortic valve regurgitation is not visualized. Aortic valve mean gradient measures 3.0 mmHg. Aortic valve peak gradient measures 5.1 mmHg. Aortic valve area, by VTI measures 1.97 cm. Pulmonic Valve: The pulmonic valve was normal in structure. Pulmonic valve regurgitation is not visualized. Aorta: Aortic dilatation noted. There is borderline dilatation of the aortic root, measuring 39 mm. IAS/Shunts: No atrial level shunt detected by color flow Doppler.  LEFT VENTRICLE PLAX 2D                        Biplane EF (MOD) LVIDd:         5.20 cm         LV Biplane EF:   Left LVIDs:         3.60 cm                          ventricular LV PW:         0.95 cm                          ejection LV IVS:        0.90 cm                          fraction by LVOT diam:     2.30 cm                          2D MOD LV SV:         67                               biplane is LV SV Index:   32                               45.7 %. LVOT Area:     4.15 cm                                Diastology                                 LV e' medial:    6.09 cm/s LV Volumes (MOD)               LV E/e' medial:  7.9 LV vol d, MOD  161.0 ml      LV e' lateral:   5.22 cm/s A2C:                           LV E/e' lateral: 9.3 LV vol d, MOD    185.0 ml A4C: LV vol s, MOD    95.1 ml A2C: LV vol s, MOD    101.0 ml A4C: LV SV MOD A2C:   65.9 ml LV SV MOD A4C:   185.0 ml LV SV MOD BP:    84.6 ml RIGHT VENTRICLE TAPSE (M-mode): 2.1 cm LEFT ATRIUM             Index        RIGHT ATRIUM           Index LA Vol (A2C):   83.8 ml 39.27 ml/m  RA Area:     15.00 cm LA Vol (A4C):   72.7 ml 34.06 ml/m  RA Volume:   32.90 ml  15.42 ml/m LA Biplane Vol: 82.9 ml 38.84 ml/m  AORTIC VALVE AV Area (Vmax):    2.47 cm AV Area (Vmean):   2.36 cm AV Area (VTI):     1.97 cm AV Vmax:           113.00 cm/s AV Vmean:          84.600 cm/s AV VTI:            0.341 m AV Peak Grad:      5.1 mmHg AV Mean Grad:      3.0 mmHg LVOT Vmax:         67.20 cm/s LVOT Vmean:        48.000 cm/s LVOT VTI:          0.162 m LVOT/AV VTI ratio: 0.48  AORTA Ao Root diam: 3.90 cm Ao Asc diam:  3.50 cm MITRAL VALVE MV Area (PHT): 2.46 cm    SHUNTS MV Decel Time: 309 msec    Systemic VTI:  0.16 m MV E velocity: 48.40 cm/s  Systemic Diam: 2.30 cm MV A velocity: 84.00 cm/s MV E/A ratio:  0.58 Zoila Shutter MD Electronically signed by Zoila Shutter MD Signature Date/Time: 08/13/2022/3:53:37 PM    Final    CARDIAC CATHETERIZATION  Result Date: 08/13/2022   Prox Cx lesion is 60% stenosed.   Previously placed Ost LAD to Mid LAD stent of unknown type is  widely patent.   LV end diastolic pressure is normal. Nonobstructive CAD. Patent stents in the proximal to mid LAD. 60% smooth stenosis in the proximal LCx Normal LVEDP Plan: continue medical therapy.   DG Chest Portable 1 View  Result Date: 08/12/2022 CLINICAL DATA:  Fatigue EXAM: PORTABLE CHEST 1 VIEW COMPARISON:  Chest radiograph 03/12/2022 FINDINGS: The cardiomediastinal silhouette is within normal limits. Mild linear  opacities in the lung bases likely reflect subsegmental atelectasis. There is no focal consolidation or pulmonary edema. There is no pleural effusion or pneumothorax There is no acute osseous abnormality. IMPRESSION: Probable bibasilar subsegmental atelectasis. Otherwise, no radiographic evidence of acute cardiopulmonary process. Electronically Signed   By: Lesia Hausen M.D.   On: 08/12/2022 14:54    Cardiac Studies   LHC 08/13/22:   Prox Cx lesion is 60% stenosed.   Previously placed Ost LAD to Mid LAD stent of unknown type is  widely patent.   LV end diastolic pressure is normal.   Nonobstructive CAD. Patent stents in the proximal to mid LAD.  60% smooth stenosis in the proximal LCx Normal LVEDP   Plan: continue medical therapy.  Echo 08/13/22 IMPRESSIONS     1. Left ventricular ejection fraction, by estimation, is 45 to 50%. Left  ventricular ejection fraction by 2D MOD biplane is 45.7 %. The left  ventricle has mildly decreased function. The left ventricle demonstrates  regional wall motion abnormalities (see   scoring diagram/findings for description). Left ventricular diastolic  parameters are consistent with Grade I diastolic dysfunction (impaired  relaxation). There is mild hypokinesis of the left ventricular, basal-mid  inferoseptal wall and inferior wall.   2. Right ventricular systolic function is low normal. The right  ventricular size is normal. Tricuspid regurgitation signal is inadequate  for assessing PA pressure.   3. Left atrial size was moderately dilated.   4. The mitral valve is grossly normal. Trivial mitral valve  regurgitation.   5. The aortic valve is tricuspid. Aortic valve regurgitation is not  visualized.   6. Aortic dilatation noted. There is borderline dilatation of the aortic  root, measuring 39 mm.   Patient Profile     76 y.o. male with CAD s/p PCI and hyperlipidemia admitted with NSTEMI.  Assessment & Plan    #Demand ischemia:  #  Hyperlipidemia: High-sensitivity troponin was elevated to the 400s.  Patient reports feeling generally unwell and fatigued for the last 2 weeks.  Cath yesterday revealed moderate left circumflex disease unchanged from prior report.  LAD stents were patent.  LVEDP was normal.  This was likely demand ischemia.  He thinks he may have had some kind of viral infection.  Lipids are well controlled.  LP(a) is pending.  Continue medical management with aspirin, atorvastatin, and carvedilol.  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  Evidence of prior MI.  This is likely old from his prior events.  He is euvolemic and doing well.  Blood pressures are uncontrolled.  His goal is less than 130/80.  Continue carvedilol and add losartan 25 mg daily.  He will need a BMP in 1 week.  He will follow-up with his primary cardiologist.  Have asked him to check his blood pressures and bring to that appointment.  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  Blood pressure control including beta-blocker as above.  [facility] will sign off.   Medication Recommendations:  added losartan Other recommendations (labs, testing, etc):  BMP in 1 week Follow up as an outpatient:  with his cardiologist w/in one month   For questions or updates, please contact [facility] Please consult [email or web address] for contact info under        Signed, Chilton Si, MD  08/14/2022, 9:06 AM

## 2022-08-14 NOTE — Discharge Summary (Signed)
Physician Discharge Summary  Benjamin [name] ZOX:[id number] DOB: [birth year] DOA: 08/12/2022  PCP: Pcp, No  Admit date: 08/12/2022 Discharge date: 08/14/2022  Time spent: 55 minutes  Recommendations for Outpatient Follow-up:  Follow-up with Dr. Heide Scales, MD cardiology in 2 weeks.  Patient will need a basic metabolic profile done in 1 week to follow-up on electrolytes and renal function. Follow-up with PCP as scheduled on 08/27/2022.  On follow-up patient blood pressure need to be reassessed.   Discharge Diagnoses:  Principal Problem:   NSTEMI (non-ST elevated myocardial infarction) (HCC) Active Problems:   Demand ischemia (HCC)   Essential hypertension   Hypocalcemia   Hyperlipidemia   BPH (benign prostatic hyperplasia)   HFrEF (heart failure with reduced ejection fraction) (HCC)   Discharge Condition: Stable and improved  Diet recommendation: Heart healthy  Filed Weights   08/12/22 2225 08/13/22 1225 08/14/22 0545  Weight: 89 kg 89 kg 86.6 kg    History of present illness:  HPI per Dr. Griffith Citron [name] is a 76 y.o. male with medical history significant of  HLD, CAD s/p PCI last in 2017 50s who presents with complaints of fatigue.  Patient states that he has not felt well for about 2 weeks with complaints of low energy.  On 9/8 he had thought he had a bad case of indigestion after eating some fried food complaints of chest pain.  Patient had taken some Gas-X and Tums that evening prior to going to bed with resolution of the symptoms after waking up the following day.  His wife notes that he just had been itself.  He was able to go to the [employer] game over the weekend and played an hour and a half game of tennis 3 days ago.  Denied having any shortness of breath although it was not his greatest gait tenderness.  Over the last couple of days patient did note that his blood pressure which is normally around 120/60 have been elevated up to 140s/70s.  Denies having any shortness  of breath, diaphoresis, palpitations, nausea, or vomiting.  His wife informed to the hospital for further evaluation.   On admission to the emergency department patient was seen  to have heart rates 55-66, blood pressures elevated 164/99, and all other vital signs maintained.  EKG showing bradycardia without significant signs of ischemia.  Labs significant for high-sensitivity troponin 344-> 410-> 231.  Patient had been given full dose aspirin and started on a heparin drip.  Cardiology consulted and recommended hospitalist admission.    Hospital Course:  #1 non-STEMI/demand ischemia -Patient presented with 2-week history of generalized fatigue, episode of chest discomfort thought secondary to ingestion as it was relieved by Tums and Gas-X. -EKG done with no significant ischemic changes noted. -High-sensitivity troponins were elevated and patient Rudin. -Fasting lipid panel with an LDL of 59, HDL of 30. -Patient placed on aspirin, statin, beta-blocker, heparin drip. -2D echo done with EF of 45 to 50%, left ventricle demonstrates regional wall motion abnormalities, grade 1 diastolic dysfunction, mild hypokinesis of the left ventricular, basal mid inferior septal wall and inferior wall, right ventricular systolic function low normal, moderately dilated left atrial size. -Patient seen in consultation by cardiology and patient underwent cardiac catheterization which showed moderate left circumflex disease unchanged from prior report, LAD stents patent, LVEDP was normal and felt per cardiology patient had a demand ischemia.  Per cardiology note patient stated may have had some kind of viral infection.   -Postcardiac catheterization patient  started on losartan and cardiology recommended continued medical management with aspirin, atorvastatin, carvedilol.   -Patient remained chest pain-free throughout the hospitalization.  -Outpatient follow-up with primary cardiologist in the outpatient setting in 2 weeks,  will need BMET in 1 week per cardiology recommendations.   -Patient was discharged in stable and improved condition.    2.  Hypertension -On presentation patient noted to have blood pressures elevated to 164/99 with a baseline around 120/70. -Patient maintained on home regimen Coreg, losartan added per cardiology post cardiac catheterization.  -Outpatient follow-up with PCP/primary cardiologist.  3.  Chronic systolic and diastolic heart failure -LVEDD 45 to 50%.  Evidence of prior MI noted. -Patient noted to be euvolemic during the hospitalization. -Goal blood pressure < 130/80. -Patient maintained on home regimen Coreg, losartan 100 per cardiology who are recommending repeat BMET in 1 week. -Outpatient follow-up with primary cardiologist.   4.  Hypocalcemia -Status post 2 g IV calcium gluconate. -Calcium at 9.4.   5.  Hyperlipidemia -LDL of 59. -Patient maintained on home regimen of high-dose statin atorvastatin 80 mg daily.   6.  BPH -Patient was maintained on home regimen Flomax    Procedures: Chest x-ray 08/12/2022 Cardiac catheterization 08/13/2022  Consultations: Cardiology: Dr.Narcisse 08/12/2022  Discharge Exam: Vitals:   08/14/22 0739 08/14/22 1122  BP:  (!) 147/83  Pulse:    Resp:    Temp: 97.9 F (36.6 C) 97.8 F (36.6 C)  SpO2:      General: NAD Cardiovascular: Regular rate rhythm no murmurs rubs or gallops.  No JVD.  No lower extremity edema. Respiratory: Clear to auscultation bilaterally.  No wheezes, no crackles, no rhonchi.  Discharge Instructions   Discharge Instructions     Diet - low sodium heart healthy   Complete by: As directed    Increase activity slowly   Complete by: As directed       Allergies as of 08/14/2022       Reactions   Penicillins Hives, Rash        Medication List     TAKE these medications    aspirin EC 81 MG tablet Take 81 mg by mouth daily.   atorvastatin 80 MG tablet Commonly known as: LIPITOR Take 80  mg by mouth at bedtime.   carvedilol 6.25 MG tablet Commonly known as: COREG Take 6.25 mg by mouth 2 (two) times daily.   Cholecalciferol 50 MCG (2000 UT) Caps Take 2,000 Units by mouth daily.   LIPOIC ACID PO Take 1 capsule by mouth daily.   losartan 25 MG tablet Commonly known as: COZAAR Take 1 tablet (25 mg total) by mouth daily. Start taking on: August 15, 2022   multivitamin with minerals tablet Take 1 tablet by mouth daily. One a Day Mens   Omega-3 1000 MG Caps Take 1,000 mg by mouth daily.   pantoprazole 40 MG tablet Commonly known as: PROTONIX Take 40 mg by mouth daily.   tamsulosin 0.4 MG Caps capsule Commonly known as: FLOMAX Take 0.4 mg by mouth at bedtime.   VITAMIN B 12 PO Take 1 tablet by mouth daily.       Allergies  Allergen Reactions   Penicillins Hives and Rash    Follow-up Information     PCP Follow up on 08/27/2022.   Why: Follow-up as scheduled.        Cheek, Osborn Coho, MD. Schedule an appointment as soon as possible for a visit in 2 week(s).   Specialty: Cardiology Why: Follow-up in 2  weeks for hospital follow-up. Will need BMET done in 1 week for follow-up on electrolytes and renal function if could be obtained hopefully at cardiologist office. Contact information: [address] [place] [id number] [phone]                  The results of significant diagnostics from this hospitalization (including imaging, microbiology, ancillary and laboratory) are listed below for reference.    Significant Diagnostic Studies: ECHOCARDIOGRAM COMPLETE  Result Date: 08/13/2022    ECHOCARDIOGRAM REPORT   Patient Name:   Rex KrasDWARD J Jackson SouthMCCOY [name] Date of Exam: 08/13/2022 Medical Rec #:  [id number]          Height:       73.0 in Accession #:    [id number]         Weight:       196.2 lb Date of Birth:  07-18-1946          BSA:          2.134 m Patient Age:    76 years           BP:           135/76 mmHg Patient Gender: M                   HR:           61 bpm. Exam Location:  Inpatient Procedure: 2D Echo, Color Doppler and Cardiac Doppler Indications:    Elevated Troponin  History:        Patient has no prior history of Echocardiogram examinations.                 CAD.  Sonographer:    Gaynell FaceEllisa Machuca Referring Phys: [id number] RONDELL A SMITH IMPRESSIONS  1. Left ventricular ejection fraction, by estimation, is 45 to 50%. Left ventricular ejection fraction by 2D MOD biplane is 45.7 %. The left ventricle has mildly decreased function. The left ventricle demonstrates regional wall motion abnormalities (see  scoring diagram/findings for description). Left ventricular diastolic parameters are consistent with Grade I diastolic dysfunction (impaired relaxation). There is mild hypokinesis of the left ventricular, basal-mid inferoseptal wall and inferior wall.  2. Right ventricular systolic function is low normal. The right ventricular size is normal. Tricuspid regurgitation signal is inadequate for assessing PA pressure.  3. Left atrial size was moderately dilated.  4. The mitral valve is grossly normal. Trivial mitral valve regurgitation.  5. The aortic valve is tricuspid. Aortic valve regurgitation is not visualized.  6. Aortic dilatation noted. There is borderline dilatation of the aortic root, measuring 39 mm. Comparison(s): No prior Echocardiogram. FINDINGS  Left Ventricle: Left ventricular ejection fraction, by estimation, is 45 to 50%. Left ventricular ejection fraction by 2D MOD biplane is 45.7 %. The left ventricle has mildly decreased function. The left ventricle demonstrates regional wall motion abnormalities. Mild hypokinesis of the left ventricular, basal-mid inferoseptal wall and inferior wall. The left ventricular internal cavity size was normal in size. There is no left ventricular hypertrophy. Left ventricular diastolic parameters are consistent with Grade I diastolic dysfunction (impaired relaxation). Indeterminate filling  pressures.  LV Wall Scoring: The basal inferior segment and basal inferoseptal segment are hypokinetic. Right Ventricle: The right ventricular size is normal. No increase in right ventricular wall thickness. Right ventricular systolic function is low normal. Tricuspid regurgitation signal is inadequate for assessing PA pressure. Left Atrium: Left atrial size was moderately dilated. Right Atrium: Right atrial size was normal  in size. Pericardium: There is no evidence of pericardial effusion. Mitral Valve: The mitral valve is grossly normal. Trivial mitral valve regurgitation. Tricuspid Valve: The tricuspid valve is grossly normal. Tricuspid valve regurgitation is trivial. Aortic Valve: The aortic valve is tricuspid. Aortic valve regurgitation is not visualized. Aortic valve mean gradient measures 3.0 mmHg. Aortic valve peak gradient measures 5.1 mmHg. Aortic valve area, by VTI measures 1.97 cm. Pulmonic Valve: The pulmonic valve was normal in structure. Pulmonic valve regurgitation is not visualized. Aorta: Aortic dilatation noted. There is borderline dilatation of the aortic root, measuring 39 mm. IAS/Shunts: No atrial level shunt detected by color flow Doppler.  LEFT VENTRICLE PLAX 2D                        Biplane EF (MOD) LVIDd:         5.20 cm         LV Biplane EF:   Left LVIDs:         3.60 cm                          ventricular LV PW:         0.95 cm                          ejection LV IVS:        0.90 cm                          fraction by LVOT diam:     2.30 cm                          2D MOD LV SV:         67                               biplane is LV SV Index:   32                               45.7 %. LVOT Area:     4.15 cm                                Diastology                                LV e' medial:    6.09 cm/s LV Volumes (MOD)               LV E/e' medial:  7.9 LV vol d, MOD    161.0 ml      LV e' lateral:   5.22 cm/s A2C:                           LV E/e' lateral: 9.3 LV vol d, MOD     185.0 ml A4C: LV vol s, MOD    95.1 ml A2C: LV vol s, MOD    101.0 ml A4C: LV SV MOD A2C:   65.9 ml LV SV MOD A4C:   185.0 ml LV SV MOD BP:  84.6 ml RIGHT VENTRICLE TAPSE (M-mode): 2.1 cm LEFT ATRIUM             Index        RIGHT ATRIUM           Index LA Vol (A2C):   83.8 ml 39.27 ml/m  RA Area:     15.00 cm LA Vol (A4C):   72.7 ml 34.06 ml/m  RA Volume:   32.90 ml  15.42 ml/m LA Biplane Vol: 82.9 ml 38.84 ml/m  AORTIC VALVE AV Area (Vmax):    2.47 cm AV Area (Vmean):   2.36 cm AV Area (VTI):     1.97 cm AV Vmax:           113.00 cm/s AV Vmean:          84.600 cm/s AV VTI:            0.341 m AV Peak Grad:      5.1 mmHg AV Mean Grad:      3.0 mmHg LVOT Vmax:         67.20 cm/s LVOT Vmean:        48.000 cm/s LVOT VTI:          0.162 m LVOT/AV VTI ratio: 0.48  AORTA Ao Root diam: 3.90 cm Ao Asc diam:  3.50 cm MITRAL VALVE MV Area (PHT): 2.46 cm    SHUNTS MV Decel Time: 309 msec    Systemic VTI:  0.16 m MV E velocity: 48.40 cm/s  Systemic Diam: 2.30 cm MV A velocity: 84.00 cm/s MV E/A ratio:  0.58 Zoila Shutter MD Electronically signed by Zoila Shutter MD Signature Date/Time: 08/13/2022/3:53:37 PM    Final    CARDIAC CATHETERIZATION  Result Date: 08/13/2022   Prox Cx lesion is 60% stenosed.   Previously placed Ost LAD to Mid LAD stent of unknown type is  widely patent.   LV end diastolic pressure is normal. Nonobstructive CAD. Patent stents in the proximal to mid LAD. 60% smooth stenosis in the proximal LCx Normal LVEDP Plan: continue medical therapy.   DG Chest Portable 1 View  Result Date: 08/12/2022 CLINICAL DATA:  Fatigue EXAM: PORTABLE CHEST 1 VIEW COMPARISON:  Chest radiograph 03/12/2022 FINDINGS: The cardiomediastinal silhouette is within normal limits. Mild linear opacities in the lung bases likely reflect subsegmental atelectasis. There is no focal consolidation or pulmonary edema. There is no pleural effusion or pneumothorax There is no acute osseous abnormality. IMPRESSION: Probable  bibasilar subsegmental atelectasis. Otherwise, no radiographic evidence of acute cardiopulmonary process. Electronically Signed   By: Lesia Hausen M.D.   On: 08/12/2022 14:54    Microbiology: Recent Results (from the past 240 hour(s))  MRSA Next Gen by PCR, Nasal     Status: None   Collection Time: 08/12/22 10:48 PM   Specimen: Nasal Mucosa; Nasal Swab  Result Value Ref Range Status   MRSA by PCR Next Gen NOT DETECTED NOT DETECTED Final    Comment: (NOTE) The GeneXpert MRSA Assay (FDA approved for NASAL specimens only), is one component of a comprehensive MRSA colonization surveillance program. It is not intended to diagnose MRSA infection nor to guide or monitor treatment for MRSA infections. Test performance is not FDA approved in patients less than 23 years old. Performed at [facility], [address] [place], [place] [address]      Labs: Basic Metabolic Panel: Recent Labs  Lab 08/12/22 1439 08/13/22 0059 08/14/22 0759  NA 138 140 139  K 3.7 3.6  3.7  CL 110 108 106  CO2 22 22 24   GLUCOSE 128* 89 153*  BUN 17 14 13   CREATININE 0.91 0.86 0.95  CALCIUM 8.3* 9.4 9.2  MG  --  2.1 2.0   Liver Function Tests: Recent Labs  Lab 08/12/22 1439 08/13/22 0059  AST 26 25  ALT 32 31  ALKPHOS 70 57  BILITOT 0.6 0.9  PROT 6.6 6.1*  ALBUMIN 3.6 3.4*   Recent Labs  Lab 08/12/22 1439  LIPASE 29   No results for input(s): "AMMONIA" in the last 168 hours. CBC: Recent Labs  Lab 08/12/22 1439 08/13/22 0059 08/14/22 0759  WBC 7.8 9.5 10.8*  NEUTROABS 5.0  --   --   HGB 14.5 14.5 15.0  HCT 41.9 41.5 44.2  MCV 92.3 92.4 93.1  PLT 168 161 174   Cardiac Enzymes: No results for input(s): "CKTOTAL", "CKMB", "CKMBINDEX", "TROPONINI" in the last 168 hours. BNP: BNP (last 3 results) No results for input(s): "BNP" in the last 8760 hours.  ProBNP (last 3 results) No results for input(s): "PROBNP" in the last 8760 hours.  CBG: No results for input(s): "GLUCAP" in  the last 168 hours.     Signed:  08/15/22 MD.  [facility] 08/14/2022, 6:39 PM

## 2022-08-15 LAB — LIPOPROTEIN A (LPA): Lipoprotein (a): 27.1 nmol/L (ref <75.0)

## 2022-11-26 ENCOUNTER — Emergency Department (HOSPITAL_BASED_OUTPATIENT_CLINIC_OR_DEPARTMENT_OTHER)
Admission: EM | Admit: 2022-11-26 | Discharge: 2022-11-26 | Disposition: A | Discharge status: Home / Self Care | Payer: Medicare HMO | Attending: Emergency Medicine | Admitting: Emergency Medicine

## 2022-11-26 ENCOUNTER — Encounter (HOSPITAL_BASED_OUTPATIENT_CLINIC_OR_DEPARTMENT_OTHER): Payer: Self-pay

## 2022-11-26 DIAGNOSIS — R059 Cough, unspecified: Secondary | ICD-10-CM | POA: Diagnosis present

## 2022-11-26 DIAGNOSIS — U071 COVID-19: Secondary | ICD-10-CM | POA: Diagnosis not present

## 2022-11-26 DIAGNOSIS — Z7982 Long term (current) use of aspirin: Secondary | ICD-10-CM | POA: Diagnosis not present

## 2022-11-26 DIAGNOSIS — I10 Essential (primary) hypertension: Secondary | ICD-10-CM | POA: Diagnosis not present

## 2022-11-26 DIAGNOSIS — I2510 Atherosclerotic heart disease of native coronary artery without angina pectoris: Secondary | ICD-10-CM | POA: Insufficient documentation

## 2022-11-26 DIAGNOSIS — Z79899 Other long term (current) drug therapy: Secondary | ICD-10-CM | POA: Diagnosis not present

## 2022-11-26 LAB — RESP PANEL BY RT-PCR (RSV, FLU A&B, COVID)  RVPGX2
Influenza A by PCR: NEGATIVE
Influenza B by PCR: NEGATIVE
Resp Syncytial Virus by PCR: NEGATIVE
SARS Coronavirus 2 by RT PCR: POSITIVE — AB

## 2022-11-26 MED ORDER — MOLNUPIRAVIR EUA 200MG CAPSULE: 4.0000 | ORAL_CAPSULE | Freq: Two times a day (BID) | ORAL | 0 refills | Status: AC

## 2022-11-26 NOTE — ED Provider Notes (Signed)
[facility] EMERGENCY DEPARTMENT Provider Note   CSN: [id number] Arrival date & time: 11/26/22  1100     History  Chief Complaint  Patient presents with   URI    Benjamin [name] is a 76 y.o. male.  Benjamin [name] is a 76 y.o. male with a history of coronary artery disease, hyperlipidemia, hypertension, GERD, who presents to the ED for evaluation of cough, congestion, loss of appetite and low-grade fever.  He reports symptoms started on Tuesday with chills, fatigue and general malaise.  He then developed a dry cough and mild sore throat.  Today he started to have some rhinorrhea and reports a few days of nasal congestion and sinus pressure.  Denies headache, reports some mild myalgias.  No chest pain or shortness of breath, abdominal pain, vomiting or diarrhea.  Denies any known sick contacts and is up-to-date on vaccinations.  The history is provided by the patient and medical records.  URI Presenting symptoms: congestion, cough, fatigue, fever, rhinorrhea and sore throat   Associated symptoms: myalgias   Associated symptoms: no headaches and no neck pain        Home Medications Prior to Admission medications   Medication Sig Start Date End Date Taking? Authorizing Provider  molnupiravir EUA (LAGEVRIO) 200 mg CAPS capsule Take 4 capsules (800 mg total) by mouth 2 (two) times daily for 5 days. 11/26/22 12/01/22 Yes Dartha Lodge, PA-C  Alpha-Lipoic Acid (LIPOIC ACID PO) Take 1 capsule by mouth daily.    [provider]  aspirin EC 81 MG tablet Take 81 mg by mouth daily.    [provider]  atorvastatin (LIPITOR) 80 MG tablet Take 80 mg by mouth at bedtime. 05/25/22   [provider]  carvedilol (COREG) 6.25 MG tablet Take 6.25 mg by mouth 2 (two) times daily. 06/18/22   [provider]  Cholecalciferol 50 MCG (2000 UT) CAPS Take 2,000 Units by mouth daily.    [provider]  Cyanocobalamin (VITAMIN B 12 PO) Take 1 tablet  by mouth daily.    [provider]  losartan (COZAAR) 25 MG tablet Take 1 tablet (25 mg total) by mouth daily. 08/15/22   Rodolph Bong, MD  Multiple Vitamins-Minerals (MULTIVITAMIN WITH MINERALS) tablet Take 1 tablet by mouth daily. One a Environmental education officer, Historical, MD  Omega-3 1000 MG CAPS Take 1,000 mg by mouth daily.    [provider]  pantoprazole (PROTONIX) 40 MG tablet Take 40 mg by mouth daily. 06/03/22   [provider]  tamsulosin (FLOMAX) 0.4 MG CAPS capsule Take 0.4 mg by mouth at bedtime. 05/23/22   [provider]      Allergies    Penicillins    Review of Systems   Review of Systems  Constitutional:  Positive for chills, fatigue and fever.  HENT:  Positive for congestion, rhinorrhea and sore throat.   Respiratory:  Positive for cough. Negative for shortness of breath.   Cardiovascular:  Negative for chest pain.  Gastrointestinal:  Negative for abdominal pain, diarrhea and vomiting.  Musculoskeletal:  Positive for myalgias. Negative for neck pain and neck stiffness.  Neurological:  Negative for headaches.    Physical Exam Updated Vital Signs BP 121/88 (BP Location: Right Arm)   Pulse 69   Temp 98.8 F (37.1 C) (Oral)   Resp 18   SpO2 95%  Physical Exam Vitals and nursing note reviewed.  Constitutional:      General:  He is not in acute distress.    Appearance: He is well-developed. He is not ill-appearing or diaphoretic.  HENT:     Head: Normocephalic and atraumatic.     Right Ear: Tympanic membrane and ear canal normal.     Nose: Rhinorrhea present.     Mouth/Throat:     Mouth: Mucous membranes are moist.     Pharynx: Oropharynx is clear. Posterior oropharyngeal erythema present. No oropharyngeal exudate.  Eyes:     General:        Right eye: No discharge.        Left eye: No discharge.  Neck:     Comments: No rigidity Cardiovascular:     Rate and Rhythm: Normal rate and regular rhythm.     Heart sounds:  Normal heart sounds. No murmur heard.    No friction rub. No gallop.  Pulmonary:     Effort: Pulmonary effort is normal. No respiratory distress.     Breath sounds: Normal breath sounds.     Comments: Respirations equal and unlabored, patient able to speak in full sentences, lungs clear to auscultation bilaterally  Abdominal:     General: Bowel sounds are normal. There is no distension.     Palpations: Abdomen is soft. There is no mass.     Tenderness: There is no abdominal tenderness. There is no guarding.     Comments: Abdomen soft, nondistended, nontender to palpation in all quadrants without guarding or peritoneal signs  Musculoskeletal:        General: No deformity.     Cervical back: Neck supple.  Lymphadenopathy:     Cervical: No cervical adenopathy.  Skin:    General: Skin is warm and dry.     Capillary Refill: Capillary refill takes less than 2 seconds.  Neurological:     Mental Status: He is alert and oriented to person, place, and time.  Psychiatric:        Mood and Affect: Mood normal.        Behavior: Behavior normal.     ED Results / Procedures / Treatments   Labs (all labs ordered are listed, but only abnormal results are displayed) Labs Reviewed  RESP PANEL BY RT-PCR (RSV, FLU A&B, COVID)  [id number] - Abnormal; Notable for the following components:      Result Value   SARS Coronavirus 2 by RT PCR POSITIVE (*)    All other components within normal limits    EKG None  Radiology No results found.  Procedures Procedures    Medications Ordered in ED Medications - No data to display  ED Course/ Medical Decision Making/ A&P                           Medical Decision Making   76 y.o. male presents with 3 days of cough, congestion, fatigue. Concerned for possible COVID influenza or other viral respiratory infection. Unknown sick contacts. Patient has received COVID vaccines and booster. Fortunately patient is overall well-appearing, vitals WNL. Patient with  no hypoxia or increased work of breathing at rest or with activity.   Given reassuring O2 sats do not feel that chest x-ray is indicated at this time.  Patient's COVID est is positive today which explains patient's symptoms, patient tested negative for influenza and RSV.Marland Kitchen   Patient with COVID infection today but overall symptoms appear mild and evaluation has been very reassuring today. No criteria for admission at this time. Discussed appropriate quarantine  at home as well as continued symptomatic treatment.  Given his age and underlying comorbidities recommend antiviral therapy, patient is on atorvastatin so will avoid Paxlovid and treat with molnupiravir.  Discussed strict return precaution. Patient expresses understanding and agreement. Discharged home in good condition.  Benjamin [name] was evaluated in Emergency Department on 11/26/2022 for the symptoms described in the history of present illness. He was evaluated in the context of the global COVID-19 pandemic, which necessitated consideration that the patient might be at risk for infection with the SARS-CoV-2 virus that causes COVID-19. Institutional protocols and algorithms that pertain to the evaluation of patients at risk for COVID-19 are in a state of rapid change based on information released by regulatory bodies including the CDC and federal and state organizations. These policies and algorithms were followed during the patient's care in the ED.         Final Clinical Impression(s) / ED Diagnoses Final diagnoses:  COVID-19 virus infection    Rx / DC Orders ED Discharge Orders          Ordered    molnupiravir EUA (LAGEVRIO) 200 mg CAPS capsule  2 times daily        11/26/22 1451              Dartha Lodge, [place] 11/26/22 1507    Linwood Dibbles, MD 11/26/22 1950

## 2022-11-26 NOTE — ED Triage Notes (Signed)
Pt c/o sinus congestion, loss of appetite, fever ("about 100"), dry cough, fatigue onset Tuesday. Denies known sick contact

## 2022-11-26 NOTE — Discharge Instructions (Signed)
You have been diagnosed with COVID-19, take antivirals as prescribed for the next 5 days to help prevent worsening of symptoms and hospitalization.  You can treat symptoms supportively with Tylenol as needed for pain, fever or headache as well as over-the-counter cough medications.  If you develop shortness of breath, difficulty breathing, persistent vomiting and unable to keep down fluids or other new or concerning symptoms please return for reevaluation.  COVID typically runs its course in about 7 to 10 days, you will need to quarantine for a minimum of 7 days from the start of your symptoms.

## 2023-12-25 IMAGING — CR DG CHEST 2V
2 series · 2 of 2 positions shown · non-contrast
Comparison: August 09, 2014.

CLINICAL DATA: Chest pain.

EXAM:
CHEST - 2 VIEW

[w chest lat]
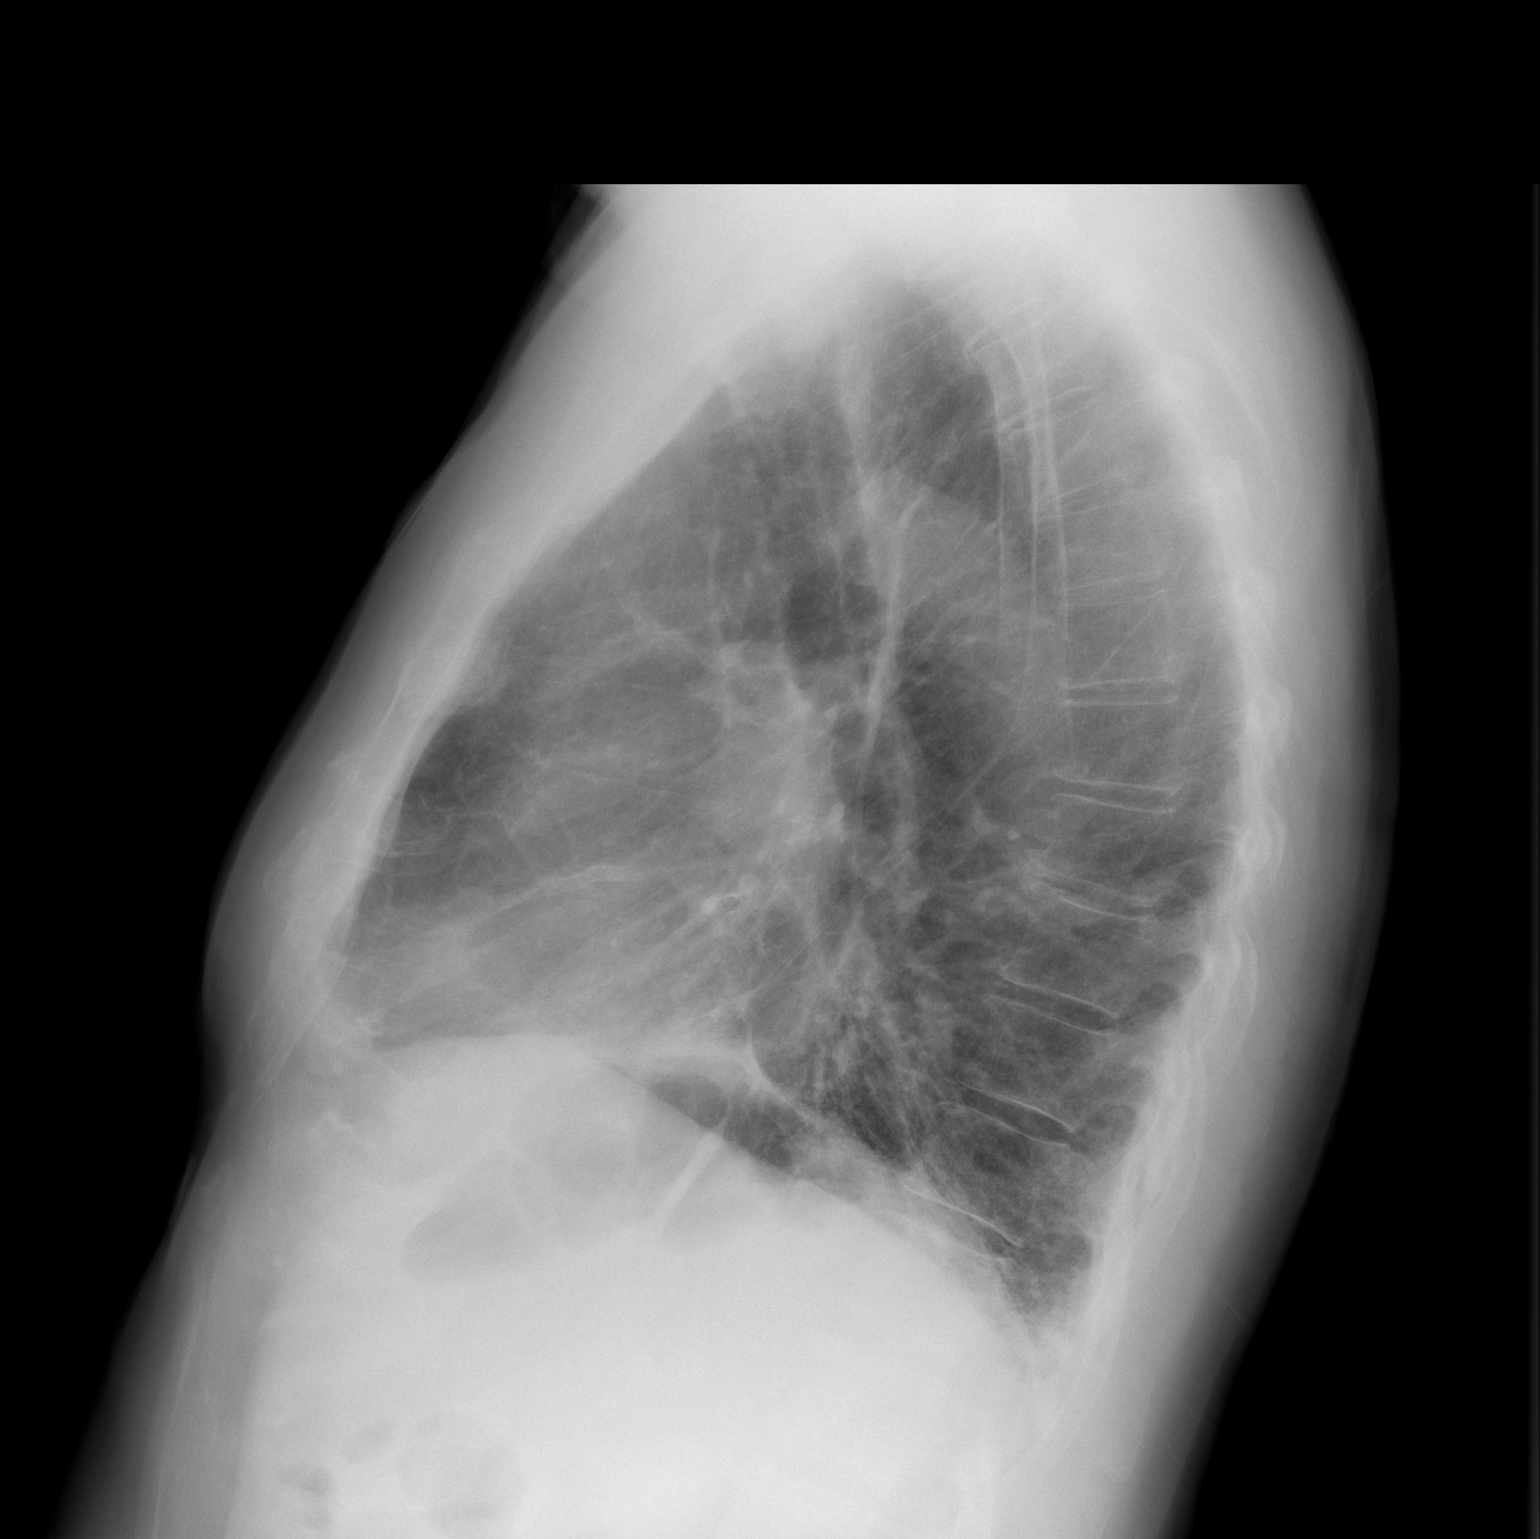

[w chest pa]
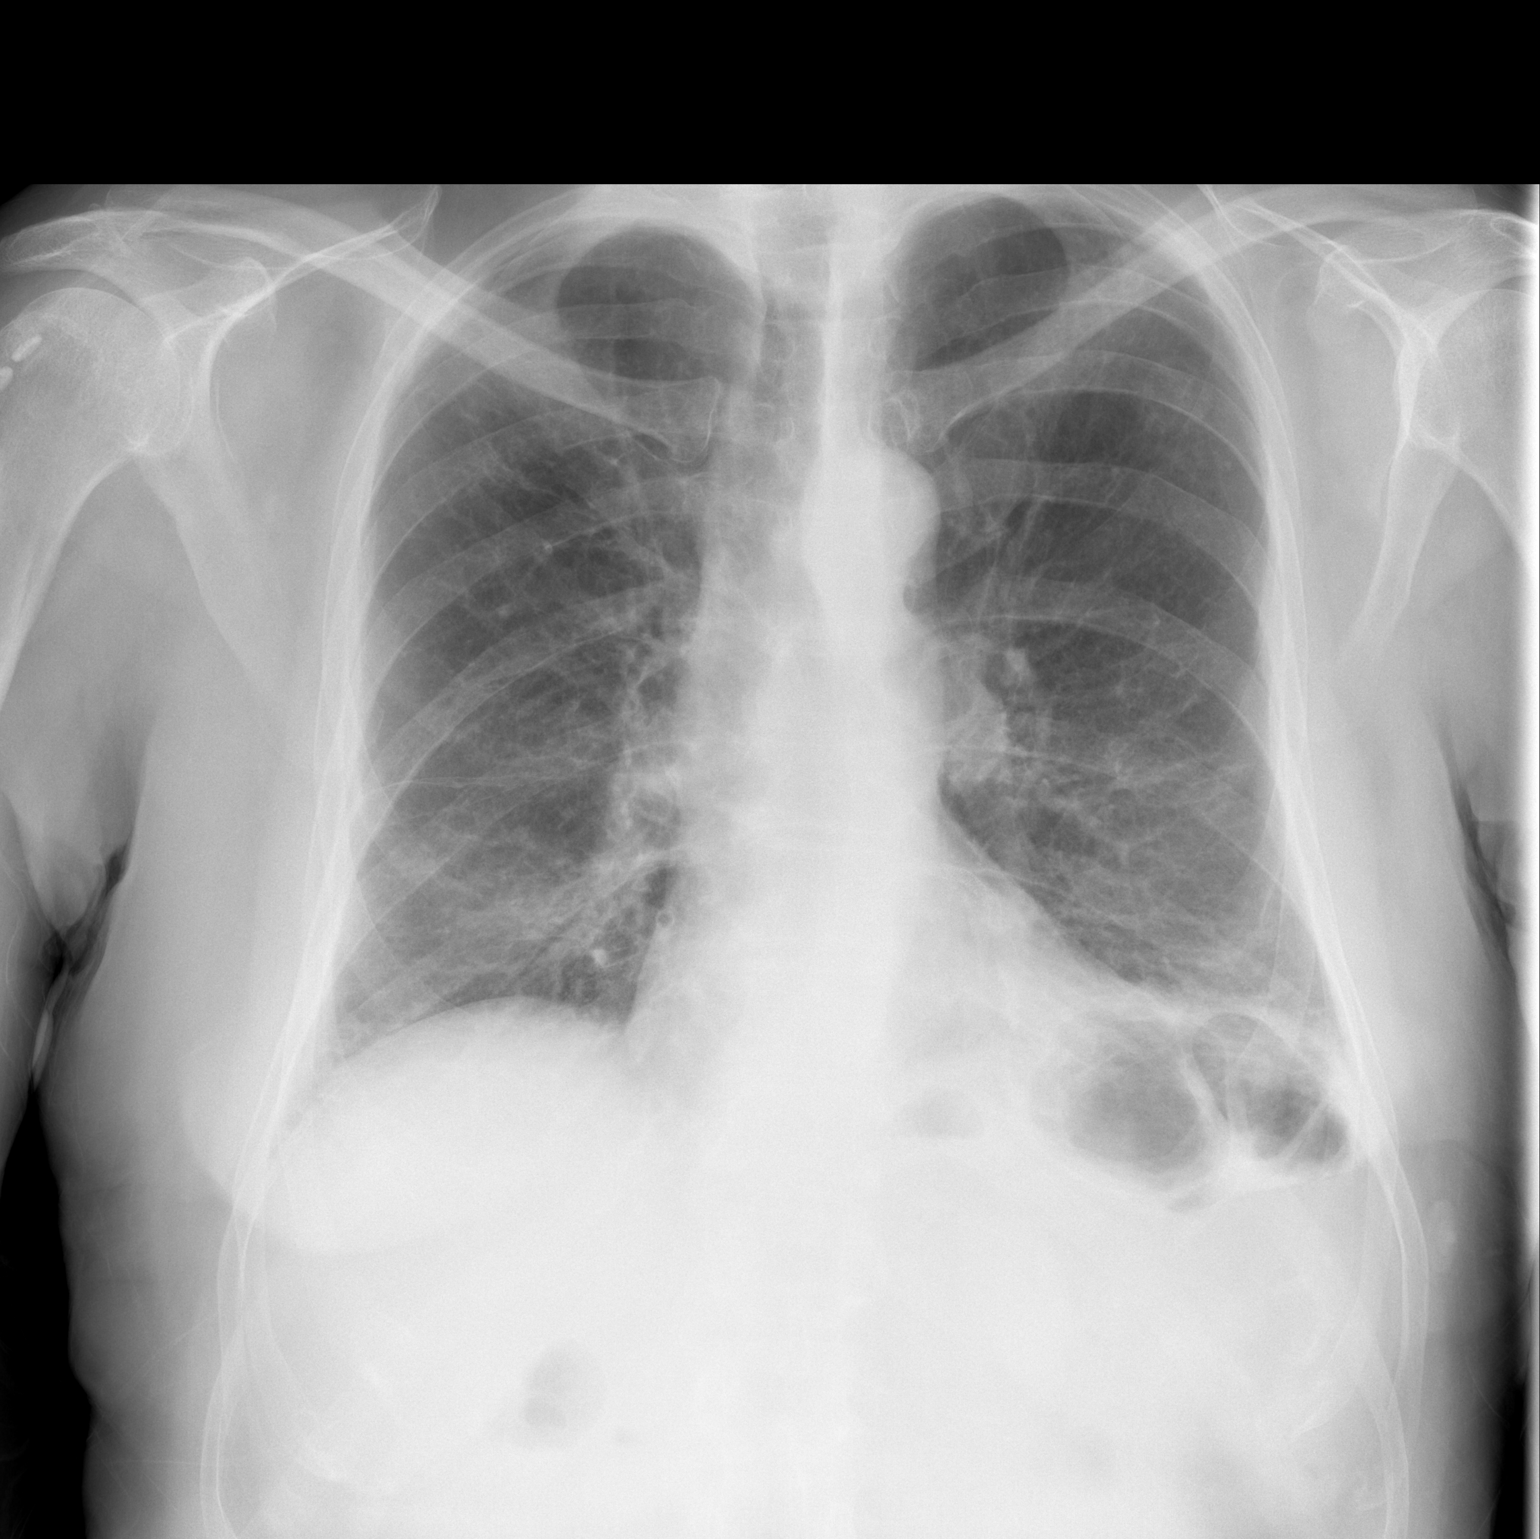

[2 of 2 positions shown; findings below may reference images not displayed]

FINDINGS: The heart size and mediastinal contours are within normal limits.
Mild right infrahilar atelectasis or infiltrate is noted. Mild left
basilar subsegmental atelectasis or scarring is noted. The
visualized skeletal structures are unremarkable.
IMPRESSION: Mild left basilar subsegmental atelectasis or scarring. Mild right
infrahilar atelectasis or infiltrate.
